# Patient Record
Sex: Male | Born: 1980 | Race: White | Hispanic: No | Marital: Married | Smoking: Current every day smoker
Health system: Southern US, Community
[De-identification: ages and names within clinical notes are randomized; demographics above are authoritative.]

## PROBLEM LIST (undated history)

## (undated) DIAGNOSIS — R569 Unspecified convulsions: Secondary | ICD-10-CM

---
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
## 2012-11-14 ENCOUNTER — Emergency Department (HOSPITAL_COMMUNITY)
Admission: EM | Admit: 2012-11-14 | Discharge: 2012-11-14 | Disposition: A | Discharge status: Home / Self Care | Payer: Self-pay | Attending: Emergency Medicine | Admitting: Emergency Medicine

## 2012-11-14 ENCOUNTER — Emergency Department (HOSPITAL_COMMUNITY): Payer: Self-pay

## 2012-11-14 ENCOUNTER — Encounter (HOSPITAL_COMMUNITY): Payer: Self-pay | Admitting: *Deleted

## 2012-11-14 DIAGNOSIS — F17200 Nicotine dependence, unspecified, uncomplicated: Secondary | ICD-10-CM | POA: Insufficient documentation

## 2012-11-14 DIAGNOSIS — Y9339 Activity, other involving climbing, rappelling and jumping off: Secondary | ICD-10-CM | POA: Insufficient documentation

## 2012-11-14 DIAGNOSIS — X58XXXA Exposure to other specified factors, initial encounter: Secondary | ICD-10-CM | POA: Insufficient documentation

## 2012-11-14 DIAGNOSIS — M79672 Pain in left foot: Secondary | ICD-10-CM

## 2012-11-14 DIAGNOSIS — S99929A Unspecified injury of unspecified foot, initial encounter: Secondary | ICD-10-CM | POA: Insufficient documentation

## 2012-11-14 DIAGNOSIS — Y929 Unspecified place or not applicable: Secondary | ICD-10-CM | POA: Insufficient documentation

## 2012-11-14 DIAGNOSIS — S8990XA Unspecified injury of unspecified lower leg, initial encounter: Secondary | ICD-10-CM | POA: Insufficient documentation

## 2012-11-14 DIAGNOSIS — Z8669 Personal history of other diseases of the nervous system and sense organs: Secondary | ICD-10-CM | POA: Insufficient documentation

## 2012-11-14 HISTORY — DX: Unspecified convulsions: R56.9

## 2012-11-14 MED ORDER — IBUPROFEN 800 MG PO TABS
800.0000 mg | ORAL_TABLET | Freq: Once | ORAL | Status: AC
  Administered 2012-11-14: 800 mg via ORAL
  Filled 2012-11-14: qty 1

## 2012-11-14 NOTE — ED Provider Notes (Signed)
History    CSN: [id number] Arrival date & time 11/14/12  0517  First MD Initiated Contact with Patient 11/14/12 [phone]     Chief Complaint  Patient presents with  . Foot Pain   (Consider location/radiation/quality/duration/timing/severity/associated sxs/prior Treatment) HPI HPI Comments: Donald Davies is a 32 y.o. male who presents to the Emergency Department complaining of left foot pain brought in by [facility] Department. He jumped off a deck two days ago and hurt his foot. He now has swelling and bruising to the foot. Denies fever, chills, inability to walk.   Past Medical History  Diagnosis Date  . Seizures    History reviewed. No pertinent past surgical history. History reviewed. No pertinent family history. History  Substance Use Topics  . Smoking status: Current Every Day Smoker  . Smokeless tobacco: Not on file  . Alcohol Use: No    Review of Systems  Constitutional: Negative for fever.       10 Systems reviewed and are negative for acute change except as noted in the HPI.  HENT: Negative for congestion.   Eyes: Negative for discharge and redness.  Respiratory: Negative for cough and shortness of breath.   Cardiovascular: Negative for chest pain.  Gastrointestinal: Negative for vomiting and abdominal pain.  Musculoskeletal: Negative for back pain.       Left foot pain  Skin: Negative for rash.  Neurological: Negative for syncope, numbness and headaches.  Psychiatric/Behavioral:       No behavior change.    Allergies  Ritalin and Robitussin (alcohol free)  Home Medications  No current outpatient prescriptions on file. BP 125/82  Pulse 101  Temp(Src) 98.9 F (37.2 C) (Oral)  Resp 18  Ht 6' (1.829 m)  Wt 163 lb (73.936 kg)  BMI 22.1 kg/m2  SpO2 100% Physical Exam  Nursing note and vitals reviewed. Constitutional: He appears well-developed and well-nourished.  Awake, alert, nontoxic appearance.  HENT:  Head: Normocephalic and  atraumatic.  Eyes: EOM are normal. Pupils are equal, round, and reactive to light.  Neck: Normal range of motion. Neck supple.  Cardiovascular: Normal rate and intact distal pulses.   Pulmonary/Chest: Effort normal and breath sounds normal. He exhibits no tenderness.  Abdominal: Soft. Bowel sounds are normal. There is no tenderness. There is no rebound.  Musculoskeletal: He exhibits no tenderness.  Baseline ROM, no obvious new focal weakness. Left foot with mild swelling over the dorsum of the foot, mild swelling to the lateral side of the foot, bruising that extends along the lateral edge of the foot. Able to move all toes. Pulses 2+  Neurological:  Mental status and motor strength appears baseline for patient and situation.  Skin: No rash noted.  Psychiatric: He has a normal mood and affect.    ED Course  Procedures (including critical care time) Labs Reviewed - No data to display Dg Foot Complete Left  11/14/2012   *RADIOLOGY REPORT*  Clinical Data: Left foot pain and bruising.  Swelling of the left foot.  LEFT FOOT - COMPLETE 3+ VIEW  Comparison: None.  Findings: Anatomic alignment of the left foot.  No fracture is identified.  Mild forefoot soft tissue swelling is present.  IMPRESSION: No acute osseous abnormality.   Original Report Authenticated By: Andreas Newport, M.D.     MDM  Patient who presented with left foot pain from an injury sustained two days ago. Xray is negative for acute injury or broken bones. Given ibuprofen. Pt stable in ED with no significant  deterioration in condition.The patient appears reasonably screened and/or stabilized for discharge and I doubt any other medical condition or other [facility] requiring further screening, evaluation, or treatment in the ED at this time prior to discharge.  MDM Reviewed: nursing note and vitals Interpretation: x-ray     Nicoletta Dress. Colon Branch, MD 11/14/12 [phone]

## 2012-11-14 NOTE — ED Notes (Signed)
Pt c/o left foot pain. 

## 2013-06-26 ENCOUNTER — Telehealth (HOSPITAL_COMMUNITY): Payer: Self-pay | Admitting: *Deleted

## 2013-06-26 NOTE — Telephone Encounter (Signed)
Opened in error

## 2014-04-28 ENCOUNTER — Encounter (HOSPITAL_COMMUNITY): Payer: Self-pay | Admitting: Cardiology

## 2014-04-28 ENCOUNTER — Emergency Department (HOSPITAL_COMMUNITY)
Admission: EM | Admit: 2014-04-28 | Discharge: 2014-04-28 | Disposition: A | Discharge status: Home / Self Care | Payer: Self-pay | Attending: Emergency Medicine | Admitting: Emergency Medicine

## 2014-04-28 DIAGNOSIS — H6503 Acute serous otitis media, bilateral: Secondary | ICD-10-CM | POA: Insufficient documentation

## 2014-04-28 DIAGNOSIS — Z720 Tobacco use: Secondary | ICD-10-CM | POA: Insufficient documentation

## 2014-04-28 DIAGNOSIS — H7291 Unspecified perforation of tympanic membrane, right ear: Secondary | ICD-10-CM | POA: Insufficient documentation

## 2014-04-28 DIAGNOSIS — K029 Dental caries, unspecified: Secondary | ICD-10-CM | POA: Insufficient documentation

## 2014-04-28 DIAGNOSIS — K088 Other specified disorders of teeth and supporting structures: Secondary | ICD-10-CM | POA: Insufficient documentation

## 2014-04-28 DIAGNOSIS — J029 Acute pharyngitis, unspecified: Secondary | ICD-10-CM | POA: Insufficient documentation

## 2014-04-28 MED ORDER — AMOXICILLIN 500 MG PO CAPS: 500.0000 mg | ORAL_CAPSULE | Freq: Three times a day (TID) | ORAL | Status: DC

## 2014-04-28 MED ORDER — OXYCODONE-ACETAMINOPHEN 5-325 MG PO TABS
1.0000 | ORAL_TABLET | Freq: Once | ORAL | Status: AC
  Administered 2014-04-28: 1 via ORAL
  Filled 2014-04-28: qty 1

## 2014-04-28 MED ORDER — AMOXICILLIN 250 MG PO CAPS
500.0000 mg | ORAL_CAPSULE | Freq: Once | ORAL | Status: AC
  Administered 2014-04-28: 500 mg via ORAL
  Filled 2014-04-28: qty 2

## 2014-04-28 MED ORDER — HYDROCODONE-ACETAMINOPHEN 5-325 MG PO TABS: 1.0000 | ORAL_TABLET | ORAL | Status: DC | PRN

## 2014-04-28 NOTE — ED Provider Notes (Signed)
CSN: [id number]     Arrival date & time 04/28/14  0820 History   First MD Initiated Contact with Patient 04/28/14 [phone]     Chief Complaint  Patient presents with  . Sore Throat     (Consider location/radiation/quality/duration/timing/severity/associated sxs/prior Treatment) Patient is a 33 y.o. male presenting with pharyngitis. The history is provided by the patient. No language interpreter was used.  Sore Throat This is a new problem. The current episode started in the past 7 days. The problem occurs constantly. The problem has been gradually worsening. Associated symptoms include a sore throat. The symptoms are aggravated by swallowing. He has tried NSAIDs for the symptoms. The treatment provided mild relief.   Donald DecampBrian Davies is a 33 y.o. male who presents to the ED with a sore throat that started one week ago. He describes the pain as throbbing on the left side. He complains of pain in both ears and pain in the left upper second and third molars. He rates the pain as 9/10. He has taken ibuprofen with mild relief. He has no difficulty swallowing. No one else in the house with a sore throat. Hx of ear problems when he was younger requiring tubes in his ears. Patient is an every day smoker.   Past Medical History  Diagnosis Date  . Seizures    History reviewed. No pertinent past surgical history. History reviewed. No pertinent family history. History  Substance Use Topics  . Smoking status: Current Every Day Smoker  . Smokeless tobacco: Not on file  . Alcohol Use: No    Review of Systems  HENT: Positive for dental problem, ear pain and sore throat. Negative for trouble swallowing. Hearing loss: decreased.   All other systems negative    Allergies  Ritalin and Robitussin (alcohol free)  Home Medications   Prior to Admission medications   Not on File   BP 138/91 mmHg  Pulse 85  Temp(Src) 98 F (36.7 C) (Oral)  Resp 18  Ht 6' (1.829 m)  Wt 163 lb (73.936 kg)  BMI 22.10  kg/m2  SpO2 96% Physical Exam  Constitutional: He is oriented to person, place, and time. He appears well-developed and well-nourished.  HENT:  Head: Normocephalic and atraumatic.  Right Ear: Tympanic membrane is scarred, perforated and erythematous.  Left Ear: Tympanic membrane is scarred, erythematous and retracted.  Nose: Rhinorrhea present.  Mouth/Throat: Uvula is midline and mucous membranes are normal. Posterior oropharyngeal erythema present.    Left upper second and third molars with decay and tenderness on exam.   Eyes: EOM are normal.  Neck: Neck supple.  Cardiovascular: Normal rate.   Pulmonary/Chest: Effort normal.  Musculoskeletal: Normal range of motion.  Lymphadenopathy:    He has cervical adenopathy (left).  Neurological: He is alert and oriented to person, place, and time. No cranial nerve deficit.  Skin: Skin is warm and dry.  Psychiatric: He has a normal mood and affect. His behavior is normal.  Nursing note and vitals reviewed.   ED Course  Procedures    MDM  33 y.o. male with sore throat, ear pain and dental pain. Stable for discharge without fever, difficulty swallowing or signs of sepsis. Will treat for pain and infection. He will continue to take ibuprofen and follow up with his PCP on Monday as scheduled and the dentist next week as scheduled for dental extraction. He will return here as needed for problems. Discussed with the patient and all questioned fully answered. Otitis media, bilateral with TM perforation  right Pharyngitis Dental pain due to caries   Medication List    TAKE these medications        amoxicillin 500 MG capsule  Commonly known as:  AMOXIL  Take 1 capsule (500 mg total) by mouth 3 (three) times daily.     HYDROcodone-acetaminophen 5-325 MG per tablet  Commonly known as:  NORCO/VICODIN  Take 1 tablet by mouth every 4 (four) hours as needed.              LivermoreHope M Amari Zagal, [place]NP 04/28/14 [id number]  Juliet RudeNathan R. Rubin PayorPickering,  MD 04/28/14 [phone]

## 2014-04-28 NOTE — ED Notes (Signed)
sorethroat times one week.

## 2014-10-04 ENCOUNTER — Encounter (HOSPITAL_COMMUNITY): Payer: Self-pay | Admitting: Emergency Medicine

## 2014-10-04 ENCOUNTER — Emergency Department (HOSPITAL_COMMUNITY): Payer: Self-pay

## 2014-10-04 ENCOUNTER — Inpatient Hospital Stay (HOSPITAL_COMMUNITY): Payer: Self-pay

## 2014-10-04 ENCOUNTER — Inpatient Hospital Stay (HOSPITAL_COMMUNITY)
Admission: EM | Admit: 2014-10-04 | Discharge: 2014-10-09 | DRG: 131 | Disposition: A | Discharge status: Home / Self Care | Payer: Self-pay | Attending: Internal Medicine | Admitting: Internal Medicine

## 2014-10-04 DIAGNOSIS — Z299 Encounter for prophylactic measures, unspecified: Secondary | ICD-10-CM

## 2014-10-04 DIAGNOSIS — L0201 Cutaneous abscess of face: Secondary | ICD-10-CM

## 2014-10-04 DIAGNOSIS — F17210 Nicotine dependence, cigarettes, uncomplicated: Secondary | ICD-10-CM | POA: Diagnosis present

## 2014-10-04 DIAGNOSIS — L03211 Cellulitis of face: Secondary | ICD-10-CM | POA: Diagnosis present

## 2014-10-04 DIAGNOSIS — K5900 Constipation, unspecified: Secondary | ICD-10-CM | POA: Diagnosis present

## 2014-10-04 DIAGNOSIS — K029 Dental caries, unspecified: Secondary | ICD-10-CM | POA: Diagnosis present

## 2014-10-04 DIAGNOSIS — F17200 Nicotine dependence, unspecified, uncomplicated: Secondary | ICD-10-CM | POA: Diagnosis present

## 2014-10-04 DIAGNOSIS — L089 Local infection of the skin and subcutaneous tissue, unspecified: Secondary | ICD-10-CM | POA: Insufficient documentation

## 2014-10-04 DIAGNOSIS — G4700 Insomnia, unspecified: Secondary | ICD-10-CM | POA: Diagnosis present

## 2014-10-04 DIAGNOSIS — R220 Localized swelling, mass and lump, head: Secondary | ICD-10-CM

## 2014-10-04 DIAGNOSIS — K045 Chronic apical periodontitis: Secondary | ICD-10-CM | POA: Diagnosis present

## 2014-10-04 DIAGNOSIS — K047 Periapical abscess without sinus: Principal | ICD-10-CM | POA: Diagnosis present

## 2014-10-04 DIAGNOSIS — B998 Other infectious disease: Secondary | ICD-10-CM

## 2014-10-04 DIAGNOSIS — D72829 Elevated white blood cell count, unspecified: Secondary | ICD-10-CM | POA: Diagnosis present

## 2014-10-04 DIAGNOSIS — Z7901 Long term (current) use of anticoagulants: Secondary | ICD-10-CM

## 2014-10-04 DIAGNOSIS — K040 Pulpitis: Secondary | ICD-10-CM

## 2014-10-04 LAB — BASIC METABOLIC PANEL
Anion gap: 9 (ref 5–15)
Anion gap: 11 (ref 5–15)
BUN: 15 mg/dL (ref 6–20)
BUN: 9 mg/dL (ref 6–20)
CALCIUM: 9.1 mg/dL (ref 8.9–10.3)
CO2: 26 mmol/L (ref 22–32)
CO2: 25 mmol/L (ref 22–32)
Calcium: 9.5 mg/dL (ref 8.9–10.3)
Chloride: 100 mmol/L — ABNORMAL LOW (ref 101–111)
Chloride: 104 mmol/L (ref 101–111)
Creatinine, Ser: 0.94 mg/dL (ref 0.61–1.24)
Creatinine, Ser: 0.9 mg/dL (ref 0.61–1.24)
GFR calc Af Amer: >60 mL/min (ref >60)
GFR calc Af Amer: >60 mL/min (ref >60)
Glucose, Bld: 129 mg/dL — ABNORMAL HIGH (ref 65–99)
Glucose, Bld: 99 mg/dL (ref 65–99)
POTASSIUM: 3.5 mmol/L (ref 3.5–5.1)
Potassium: 4.6 mmol/L (ref 3.5–5.1)
SODIUM: 135 mmol/L (ref 135–145)
SODIUM: 140 mmol/L (ref 135–145)

## 2014-10-04 LAB — CBC WITH DIFFERENTIAL/PLATELET
Basophils Absolute: 0 10*3/uL (ref 0.0–0.1)
Basophils Relative: 0 % (ref 0–1)
EOS ABS: 0.1 10*3/uL (ref 0.0–0.7)
EOS PCT: 1 % (ref 0–5)
HCT: 47.5 % (ref 39.0–52.0)
Hemoglobin: 16 g/dL (ref 13.0–17.0)
Lymphocytes Relative: 15 % (ref 12–46)
Lymphs Abs: 2.4 10*3/uL (ref 0.7–4.0)
MCH: 30.4 pg (ref 26.0–34.0)
MCHC: 33.7 g/dL (ref 30.0–36.0)
MCV: 90.3 fL (ref 78.0–100.0)
MONO ABS: 1.5 10*3/uL — AB (ref 0.1–1.0)
Monocytes Relative: 9 % (ref 3–12)
NEUTROS ABS: 12.4 10*3/uL — AB (ref 1.7–7.7)
NEUTROS PCT: 75 % (ref 43–77)
Platelets: 285 10*3/uL (ref 150–400)
RBC: 5.26 MIL/uL (ref 4.22–5.81)
RDW: 13.5 % (ref 11.5–15.5)
WBC: 16.4 10*3/uL — AB (ref 4.0–10.5)

## 2014-10-04 LAB — CBC
HCT: 47.2 % (ref 39.0–52.0)
Hemoglobin: 15.7 g/dL (ref 13.0–17.0)
MCH: 29.7 pg (ref 26.0–34.0)
MCHC: 33.3 g/dL (ref 30.0–36.0)
MCV: 89.4 fL (ref 78.0–100.0)
PLATELETS: 254 10*3/uL (ref 150–400)
RBC: 5.28 MIL/uL (ref 4.22–5.81)
RDW: 13.8 % (ref 11.5–15.5)
WBC: 13.8 10*3/uL — ABNORMAL HIGH (ref 4.0–10.5)

## 2014-10-04 LAB — LACTIC ACID, PLASMA: Lactic Acid, Venous: 1.2 mmol/L (ref 0.5–2.0)

## 2014-10-04 MED ORDER — MORPHINE SULFATE 4 MG/ML IJ SOLN
4.0000 mg | Freq: Once | INTRAMUSCULAR | Status: AC
  Administered 2014-10-04: 4 mg via INTRAVENOUS
  Filled 2014-10-04: qty 1

## 2014-10-04 MED ORDER — ACETAMINOPHEN 500 MG PO TABS
1000.0000 mg | ORAL_TABLET | Freq: Once | ORAL | Status: AC
  Administered 2014-10-04: 1000 mg via ORAL
  Filled 2014-10-04: qty 2

## 2014-10-04 MED ORDER — OXYCODONE HCL 5 MG PO TABS
5.0000 mg | ORAL_TABLET | ORAL | Status: DC | PRN
  Administered 2014-10-05 – 2014-10-09 (×8): 5 mg via ORAL
  Filled 2014-10-04 (×11): qty 1

## 2014-10-04 MED ORDER — ZOLPIDEM TARTRATE 5 MG PO TABS: 5.0000 mg | ORAL_TABLET | Freq: Every evening | ORAL | Status: DC | PRN

## 2014-10-04 MED ORDER — ONDANSETRON HCL 4 MG/2ML IJ SOLN: 4.0000 mg | Freq: Four times a day (QID) | INTRAMUSCULAR | Status: DC | PRN

## 2014-10-04 MED ORDER — CLINDAMYCIN PHOSPHATE 600 MG/50ML IV SOLN
600.0000 mg | Freq: Three times a day (TID) | INTRAVENOUS | Status: DC
  Administered 2014-10-04 – 2014-10-09 (×15): 600 mg via INTRAVENOUS
  Filled 2014-10-04 (×18): qty 50

## 2014-10-04 MED ORDER — ALUM & MAG HYDROXIDE-SIMETH 200-200-20 MG/5ML PO SUSP: 30.0000 mL | Freq: Four times a day (QID) | ORAL | Status: DC | PRN

## 2014-10-04 MED ORDER — NICOTINE 7 MG/24HR TD PT24
7.0000 mg | MEDICATED_PATCH | Freq: Every day | TRANSDERMAL | Status: DC
  Administered 2014-10-09: 7 mg via TRANSDERMAL
  Filled 2014-10-04 (×7): qty 1

## 2014-10-04 MED ORDER — HYDROMORPHONE HCL 1 MG/ML IJ SOLN
0.5000 mg | INTRAMUSCULAR | Status: DC | PRN
  Administered 2014-10-04 – 2014-10-09 (×32): 1 mg via INTRAVENOUS
  Filled 2014-10-04 (×34): qty 1

## 2014-10-04 MED ORDER — SODIUM CHLORIDE 0.9 % IV SOLN
INTRAVENOUS | Status: DC
  Administered 2014-10-04 – 2014-10-06 (×5): via INTRAVENOUS
  Administered 2014-10-07: 75 mL/h via INTRAVENOUS
  Administered 2014-10-07 – 2014-10-08 (×2): via INTRAVENOUS

## 2014-10-04 MED ORDER — CLINDAMYCIN PHOSPHATE 600 MG/50ML IV SOLN
600.0000 mg | Freq: Once | INTRAVENOUS | Status: AC
  Administered 2014-10-04: 600 mg via INTRAVENOUS
  Filled 2014-10-04: qty 50

## 2014-10-04 MED ORDER — ACETAMINOPHEN 325 MG PO TABS
650.0000 mg | ORAL_TABLET | Freq: Four times a day (QID) | ORAL | Status: DC | PRN
  Filled 2014-10-04 (×2): qty 2

## 2014-10-04 MED ORDER — ONDANSETRON HCL 4 MG/2ML IJ SOLN
4.0000 mg | Freq: Once | INTRAMUSCULAR | Status: AC
  Administered 2014-10-04: 4 mg via INTRAMUSCULAR
  Filled 2014-10-04: qty 2

## 2014-10-04 MED ORDER — CLINDAMYCIN PHOSPHATE 600 MG/50ML IV SOLN: 600.0000 mg | Freq: Three times a day (TID) | INTRAVENOUS | Status: DC

## 2014-10-04 MED ORDER — IOHEXOL 300 MG/ML  SOLN
80.0000 mL | Freq: Once | INTRAMUSCULAR | Status: AC | PRN
  Administered 2014-10-04: 80 mL via INTRAVENOUS

## 2014-10-04 MED ORDER — ONDANSETRON HCL 4 MG PO TABS: 4.0000 mg | ORAL_TABLET | Freq: Four times a day (QID) | ORAL | Status: DC | PRN

## 2014-10-04 MED ORDER — ACETAMINOPHEN 650 MG RE SUPP: 650.0000 mg | Freq: Four times a day (QID) | RECTAL | Status: DC | PRN

## 2014-10-04 NOTE — ED Notes (Signed)
[id number] Spoke with Bjorn LoserRhonda and they will send a truck.

## 2014-10-04 NOTE — Consult Note (Signed)
DENTAL CONSULTATION  Date of Consultation:  10/04/2014   Patient Name:   Donald DecampBrian Penman Date of Birth:   January 11, 1981 Medical Record Number: [id number]  VITALS: BP 129/77 mmHg  Pulse 89  Temp(Src) 97.3 F (36.3 C) (Oral)  Resp 16  Ht 5\' 11"  (1.803 m)  Wt 135 lb 4.8 oz (61.372 kg)  BMI 18.88 kg/m2  SpO2 100%  CHIEF COMPLAINT: Patient was referred by Dr. Gwenlyn PerkingMadera for evaluation of left facial swelling.   HPI: Donald Davies is a 34 year old male that was [facility] of left facial swelling. [facility] and treatment as indicated. Patient currently is on IV clindamycin antibiotic therapy. Patient was referred by Dr. Gwenlyn PerkingMadera for evaluation of left facial swelling and poor dentition.  The patient has a history of intermittent toothache symptoms over the past several months. Patient placed a DenTemp OTC material in the upper left molar 2 to 3 weeks ago. The patient is swelling subsequently increased. Patient described pain that reached an 8 out of 10 in intensity but is currently 6 out of 10 with pain medication and IV antibiotics. Patient developed left facial swelling yesterday and [facility]. [facility] was subsequently [facility] for [facility] and treatment as indicated. Patient indicates that he has not seen a dentist for "a long time". This is approximately 5-6 years ago. Patient had several "fillings" placed at that time. The patient does not seek regular dental care. The patient has no primary dentist. Patient does have a history of previous orthodontic therapy.   PROBLEM LIST: Patient Active Problem List   Diagnosis Date Noted  . Facial abscess 10/04/2014  . Cellulitis and abscess of face 10/04/2014  . Leukocytosis 10/04/2014  . Tobacco use disorder 10/04/2014  . DVT prophylaxis 10/04/2014  . Dental abscess   . Facial infection      PMH: Past Medical History  Diagnosis Date  . Seizures     PSH: History reviewed. No pertinent past surgical history.  ALLERGIES: Allergies  Allergen Reactions  . Ritalin [Methylphenidate Hcl]   . Robitussin (Alcohol Free) [Guaifenesin]     MEDICATIONS: Current Facility-Administered Medications  Medication Dose Route Frequency Provider Last Rate Last Dose  . 0.9 %  sodium chloride infusion   Intravenous Continuous Ron ParkerHarvette C Jenkins, MD 100 mL/hr at 10/04/14 [phone]    . acetaminophen (TYLENOL) tablet 650 mg  650 mg Oral Q6H PRN Ron ParkerHarvette C Jenkins, MD       Or  . acetaminophen (TYLENOL) suppository 650 mg  650 mg Rectal Q6H PRN Ron ParkerHarvette C Jenkins, MD      . alum & mag hydroxide-simeth (MAALOX/MYLANTA) 200-200-20 MG/5ML suspension 30 mL  30 mL Oral Q6H PRN Ron ParkerHarvette C Jenkins, MD      . clindamycin (CLEOCIN) IVPB 600 mg  600 mg Intravenous 3 times per day Ron ParkerHarvette C Jenkins, MD   600 mg at 10/04/14 1202  . HYDROmorphone (DILAUDID) injection 0.5-1 mg  0.5-1 mg Intravenous Q3H PRN Ron ParkerHarvette C Jenkins, MD   1 mg at 10/04/14 1202  . nicotine (NICODERM CQ - dosed in mg/24 hr) patch 7 mg  7 mg Transdermal Daily Ron ParkerHarvette C Jenkins, MD   7 mg at 10/04/14 [id number]  . ondansetron (ZOFRAN) tablet 4 mg  4 mg Oral Q6H PRN Ron ParkerHarvette C Jenkins, MD       Or  . ondansetron [facility](ZOFRAN) injection 4 mg  4 mg Intravenous Q6H PRN Ron ParkerHarvette C Jenkins, MD      . oxyCODONE (Oxy IR/ROXICODONE) immediate release tablet 5 mg  5 mg Oral Q4H PRN Ron ParkerHarvette C Jenkins, MD        LABS: Lab Results  Component Value Date   WBC 13.8* 10/04/2014   HGB 15.7 10/04/2014   HCT 47.2 10/04/2014   MCV 89.4 10/04/2014   PLT 254 10/04/2014      Component Value Date/Time   NA 140 10/04/2014 0845   K 4.6 10/04/2014 0845   CL 104 10/04/2014 0845   CO2 25 10/04/2014 0845   GLUCOSE 99 10/04/2014 0845   BUN 9 10/04/2014 0845   CREATININE 0.90 10/04/2014 0845   CALCIUM 9.5 10/04/2014 0845   GFRNONAA >60 10/04/2014 0845   GFRAA  >60 10/04/2014 0845   No results found for: INR, PROTIME No results found for: PTT  SOCIAL HISTORY: History   Social History  . Marital Status: Single    Spouse Name: N/A  . Number of Children: N/A  . Years of Education: N/A   Occupational History  . Not on file.   Social History Main Topics  . Smoking status: Current Every Day Smoker  . Smokeless tobacco: Not on file  . Alcohol Use: No  . Drug Use: No  . Sexual Activity: Not on file   Other Topics Concern  . Not on file   Social History Narrative    FAMILY HISTORY: History reviewed. No pertinent family history.  REVIEW OF SYSTEMS: Reviewed from chart for this admission.  DENTAL HISTORY: CHIEF COMPLAINT: Patient was referred by Dr. Gwenlyn PerkingMadera for evaluation of left facial swelling.   HPI: Donald Davies is a 34 year old male that was recently [facility] for evaluation of left facial swelling. Patient was then [facility] for evaluation and treatment as indicated. Patient currently is on IV clindamycin antibiotic therapy. Patient was referred by Dr. Gwenlyn PerkingMadera for evaluation of left facial swelling and poor dentition.  The patient has a history of intermittent toothache symptoms over the past several months. Patient placed a DenTemp OTC material in the upper left molar 2 to 3 weeks ago. The patient is swelling subsequently increased. Patient described pain that reached an 8 out of 10 in intensity but is currently 6 out of 10 with pain medication and IV antibiotics. Patient developed left facial swelling yesterday and [facility]. Patient was subsequently transferred [facility] for evaluation and treatment as indicated. Patient indicates that he has not seen a dentist for "a long time". This is approximately 5-6 years ago. Patient had several "fillings" placed at that time. The patient does not seek regular dental care. The patient has no  primary dentist. Patient does have a history of previous orthodontic therapy.  DENTAL EXAMINATION: GENERAL: The patient is a well-developed, well-nourished male in no acute distress. HEAD AND NECK: Patient has left neck lymphadenopathy. Patient has trismus symptoms that prevents him from opening wide. Patient's maximum interincisal opening is now approximately 15 mm. INTRAORAL EXAM: Patient has normal saliva. Patient appears to have some left buccal swelling in the area of #14 (first molar). DENTITION: The patient is missing tooth numbers 1, 4, 13, 16, 19.  Tooth #32 is partially impacted. PERIODONTAL: Patient has chronic periodontitis with plaque and calculus accumulations, selective areas gingival recession and incipient tooth mobility. DENTAL CARIES/SUBOPTIMAL RESTORATIONS: Patient has rampant dental caries affecting multiple teeth. I would  need a full series of dental radiographs to identify the extent of the dental caries. ENDODONTIC: Patient has a history of acute pulpitis symptoms. There is periapical pathology and radiolucency associated with tooth numbers 12, 14, 16, and 18. CROWN AND BRIDGE: There are no crown or bridge restorations noted. PROSTHODONTIC: Patient denies having any partial dentures. OCCLUSION: Patient has a poor occlusal scheme secondary to multiple missing teeth and superior eruption and drifting of the unopposed teeth into the edentulous areas. Patient does have a history of previous orthodontic therapy.  RADIOGRAPHIC INTERPRETATION: An orthopantogram was taken on 10/04/2014. There are multiple missing teeth. There are rampant dental caries noted. There is evidence of periapical pathology and radiolucency. There is incipient to moderate bone loss noted. There is partially impacted #32 noted. There are multiple dental restorations noted. There multiple teeth with temporary restorations noted.  ASSESSMENTS: 1. History of left facial swelling 2. Acute pulpitis 3. Chronic  apical periodontitis 4. Trismus with decreased maximum interincisal opening 5. Rampant dental caries 6. Chronic periodontitis with bone loss 7. Accretions 8. Selective areas gingival recession 9. Incipient tooth mobility 10. Multiple missing teeth 11. Partially impacted #32 12. Multiple suboptimal permanent and temporary restorations. 13. History of oral neglect  PLAN/RECOMMENDATIONS: 1. I discussed the risks, benefits, and complications of various treatment options with the patient in relationship to his medical and dental conditions. We discussed various treatment options to include no treatment, multiple extractions with alveoloplasty, pre-prosthetic surgery as indicated, periodontal therapy, dental restorations, root canal therapy, crown and bridge therapy, implant therapy, and replacement of missing teeth as indicated. The patient currently wishes to proceed with extraction of indicated teeth with alveoloplasty in the operating room with general anesthesia. This has been scheduled for Monday, 10/08/2014 at 7:30 AM in the [facility] operating room.  Due to the trismus symptoms and decreased maximum interincisal opening, continued IV antibiotic therapy is indicated at this time. If the patient is determined to be medically stable to allow for discharge prior to the Monday operating room procedure, this can be achieved by Dr. Gwenlyn Perking and the OR can be switched to an outpatient procedure. The patient will need to be informed of the time that he needs to present to the short stay area and he reminded to be nothing by mouth after midnight. The overall plan of care will then be decided at the time of surgery on Monday based on his presenting symptoms and ability to open wide enough to allow for dental treatment in the OR. In light of his presenting signs and symptoms and decreased maximum interincisal opening, I would favor continued hospitalization with administration of IV antibiotic therapy until  operating procedure on Monday.  2. Discussion of findings with medical team and coordination of future medical and dental care as needed.   Charlynne Pander, DDS

## 2014-10-04 NOTE — Progress Notes (Signed)
New Admission Note:  Arrival Method:Via stretcher with [facility] RN Mental Orientation: Alert&orientedx4 Telemetry: n/a Assessment: Completed Skin: facial edema (left) and redness IV: Left AC, normal saline @100  ml/hr Pain: 5/10, see doc flowsheet Tubes: n/a Safety Measures: Safety Fall Prevention Plan was given, discussed and signed. [address] [place] Orientation: Patient has been orientated to the room, unit and the staff. Family: None at bedside  Orders have been reviewed and implemented. Will continue to monitor the patient. Call light has been placed within reach and bed alarm has been activated.   Tempie DonningMercy Estevan Kersh BSN, RN  Phone Number: [phone] [facility] [place] [facility]

## 2014-10-04 NOTE — ED Provider Notes (Signed)
CSN: [id number]     Arrival date & time 10/04/14  0049 History   First MD Initiated Contact with Patient 10/04/14 0101     Chief Complaint  Patient presents with  . Facial Pain  . Abscess     (Consider location/radiation/quality/duration/timing/severity/associated sxs/prior Treatment) HPI  This is a 34 year old male with a history of seizure who presents with left-sided facial swelling and pain. Patient reports that he woke up this morning with pain and swelling to the left cheek. He reports that he has a chronically bad tooth.  Reports fever to 102. No difficulty swallowing but "strange sensation in the throat." Does not have a dentist. Rates his pain 10 out of 10. Has not taken anything for the pain.  Past Medical History  Diagnosis Date  . Seizures    History reviewed. No pertinent past surgical history. History reviewed. No pertinent family history. History  Substance Use Topics  . Smoking status: Current Every Day Smoker  . Smokeless tobacco: Not on file  . Alcohol Use: No    Review of Systems  Constitutional: Positive for fever.  HENT: Positive for dental problem.        Facial swelling  Respiratory: Negative.  Negative for chest tightness and shortness of breath.   Cardiovascular: Negative.  Negative for chest pain.  Gastrointestinal: Negative.  Negative for abdominal pain.  Genitourinary: Negative.  Negative for dysuria.  Musculoskeletal: Negative for back pain.  Skin: Positive for color change. Negative for rash.  Neurological: Negative for headaches.  All other systems reviewed and are negative.     Allergies  Ritalin and Robitussin (alcohol free)  Home Medications   Prior to Admission medications   Medication Sig Start Date End Date Taking? Authorizing Provider  amoxicillin (AMOXIL) 500 MG capsule Take 1 capsule (500 mg total) by mouth 3 (three) times daily. 04/28/14   Hope Orlene OchM Neese, NP  HYDROcodone-acetaminophen (NORCO/VICODIN) 5-325 MG per tablet Take 1  tablet by mouth every 4 (four) hours as needed. 04/28/14   Hope Orlene OchM Neese, NP   BP 109/67 mmHg  Pulse 63  Temp(Src) 99 F (37.2 C) (Oral)  Resp 18  Ht 5\' 11"  (1.803 m)  Wt 150 lb (68.04 kg)  BMI 20.93 kg/m2  SpO2 99% Physical Exam  Constitutional: He is oriented to person, place, and time. He appears well-developed and well-nourished.  HENT:  Head: Normocephalic and atraumatic.  Swelling and mild erythema noted to the left cheek, poor dentition, broken tooth noted over the left upper premolar, small periapical abscess wo spontaneous drainage, tenderness to palpation along the gumline and cheek  Eyes: Pupils are equal, round, and reactive to light.  Neck: Normal range of motion. Neck supple.  Cardiovascular: Normal rate, regular rhythm and normal heart sounds.   No murmur heard. Pulmonary/Chest: Effort normal and breath sounds normal. No respiratory distress. He has no wheezes.  Abdominal: Soft. There is no rebound.  Musculoskeletal: He exhibits no edema.  Lymphadenopathy:    He has no cervical adenopathy.  Neurological: He is alert and oriented to person, place, and time.  Skin: Skin is warm.  Diaphoretic  Psychiatric: He has a normal mood and affect.  Nursing note and vitals reviewed.   ED Course  Procedures (including critical care time) Labs Review Labs Reviewed  CBC WITH DIFFERENTIAL/PLATELET - Abnormal; Notable for the following:    WBC 16.4 (*)    Neutro Abs 12.4 (*)    Monocytes Absolute 1.5 (*)    All other components within  normal limits  BASIC METABOLIC PANEL - Abnormal; Notable for the following:    Chloride 100 (*)    Glucose, Bld 129 (*)    All other components within normal limits  CULTURE, BLOOD (ROUTINE X 2)  CULTURE, BLOOD (ROUTINE X 2)  LACTIC ACID, PLASMA  LACTIC ACID, PLASMA    Imaging Review Ct Maxillofacial W/cm  10/04/2014   CLINICAL DATA:  Acute onset of left cheek swelling and erythema. Initial encounter.  EXAM: CT MAXILLOFACIAL WITH  CONTRAST  TECHNIQUE: Multidetector CT imaging of the maxillofacial structures was performed with intravenous contrast. Multiplanar CT image reconstructions were also generated. A small metallic BB was placed on the right temple in order to reliably differentiate right from left.  CONTRAST:  80mL OMNIPAQUE IOHEXOL 300 MG/ML  SOLN  COMPARISON:  None.  FINDINGS: There is diffuse soft tissue inflammation and edema along the left side of the face, overlying the left maxilla and upper mandible. This appears to reflect a 1.3 x 0.7 cm soft tissue abscess overlying the left maxilla, due to diffuse erosion of the left second maxillary premolar. An underlying periapical abscess is seen, which appears to extend into the left maxillary sinus, with mucosal thickening at the left maxillary sinus.  Additional partial absence of multiple teeth is noted, at the left second maxillary molar, left first maxillary premolar, right first maxillary molar, left second mandibular molar, left second mandibular premolar, right first mandibular molar, and right second mandibular molar.  There is no evidence of fracture or dislocation. The maxilla and mandible appear intact. The nasal bone is unremarkable in appearance.  The orbits are intact bilaterally. The remaining visualized paranasal sinuses and mastoid air cells are well-aerated.  No significant soft tissue abnormalities are seen. The parapharyngeal fat planes are preserved. The nasopharynx, oropharynx and hypopharynx are unremarkable in appearance. The visualized portions of the valleculae and piriform sinuses are grossly unremarkable.  The parotid and submandibular glands are within normal limits. No cervical lymphadenopathy is seen. Visualized cervical nodes remain borderline normal in size.  IMPRESSION: 1. Periapical abscess at the root of the left second maxillary premolar, with diffuse erosion of the left second maxillary premolar. Overlying 1.3 x 0.7 cm soft tissue abscess noted,  with associated diffuse soft tissue inflammation and edema along the left side of the face. The periapical abscess appears to extend into the left maxillary sinus, with mucosal thickening at the left maxillary sinus. 2. Additional partial absence of multiple teeth, as described above.   Electronically Signed   By: Roanna RaiderJeffery  Chang M.D.   On: 10/04/2014 03:13     EKG Interpretation None      MDM   Final diagnoses:  Dental abscess  Facial infection    Patient presents with left-sided facial swelling, dental pain, fever. Nontoxic on exam. His diaphoretic and febrile to 101.2. Likely has periapical abscess but also has evidence of extension into the cheek with possible cellulitis and systemic involvement. Basic labwork obtained. Blood cultures obtained. Patient given IV clindamycin.  White blood count elevated to 16.4. CT max face with contrast shows periapical abscess with erosion into the premolar and left sinus. There is a 1.3 x 0.7 cm soft tissue abscess.  No oral surgeon on call. Discussed with Dr. Suszanne Connerseoh (ENT), who recommends admission for IV antibiotics and transfer to [facility] for dental consultation tomorrow.     Shon Batonourtney F Horton, MD 10/04/14 [phone]

## 2014-10-04 NOTE — ED Notes (Signed)
Patient reports woke up yesterday morning with redness and swelling to left cheek. Reports has a bad tooth left front tooth.

## 2014-10-04 NOTE — Progress Notes (Signed)
Patient seen and examined. Left facial swelling, pain and erythema present on exam. He was admitted at [facility] after midnight and transferred to [facility] for oral surgery evaluation due to ongoing dental abscess and facial cellulitis. Please referred to H&P dictated by Dr. Lovell SheehanJenkins for further info/details on admission.    Plan: -Panorex ordered -discussed with Dr. Kristin BruinsKulinski (he will see patient and follow Panorex) -continue IV antibiotics -continue PRN analgesics  Donald LollMadera, Donald Davies [phone]

## 2014-10-04 NOTE — H&P (Signed)
[facility] Admission History and Physical       Donald DecampBrian Heathman XBJ:[id number] DOB: 12-21-1980 DOA: 10/04/2014  Referring physician: EDP PCP: No PCP Per Patient  Specialists:   Chief Complaint: Left Sided Facial Swelling and Fever  HPI: Donald Davies is a 34 y.o. male who presents to the ED with complaints of left sided facial swelling and pain since the AM along with fevers and chills.  He reports having a toothache for several months of the left maxillary second premolar tooth and in 04/2014 had Augmentin Rx and Hydrocodone prescribed.   He has not seen a Dentist, and has placed an OTC sealant on the tooth.    He was evaluated in the ED and ws found to have a temperature to 101.2.  A Maxillofacial CT scan was performed and revealed an abscess of the left maxillary second premolar extending into the maxillary sinus with erosion and associated facial edema.    Blood Cultures were sent and he was placed on IV Clindamycin.   The EDP DR Joanne GavelHorton Spoke with ENT on Call Dr Suszanne Connerseoh who recommended transfer to [facility] and a Dental Consult this AM.       Review of Systems:  Constitutional: No Weight Loss, No Weight Gain, Night Sweats, +Fevers, +Chills, Dizziness, Light Headedness, Fatigue, or Generalized Weakness HEENT: No Headaches, +Left Face and Cheek Pain,   Difficulty Swallowing,+Tooth/Dental Problems,Sore Throat,  No Sneezing, Rhinitis, Ear Ache, Nasal Congestion, or Post Nasal Drip,  Cardio-vascular:  No Chest pain, Orthopnea, PND, Edema in Lower Extremities, Anasarca, Dizziness, Palpitations  Resp: No Dyspnea, No DOE, No Productive Cough, No Non-Productive Cough, No Hemoptysis, No Wheezing.    GI: No Heartburn, Indigestion, Abdominal Pain, Nausea, Vomiting, Diarrhea, Constipation, Hematemesis, Hematochezia, Melena, Change in Bowel Habits,  Loss of Appetite  GU: No Dysuria, No Change in Color of Urine, No Urgency or Urinary Frequency, No Flank pain.  Musculoskeletal: No Joint Pain or  Swelling, No Decreased Range of Motion, No Back Pain.  Neurologic: No Syncope, No Seizures, Muscle Weakness, Paresthesia, Vision Disturbance or Loss, No Diplopia, No Vertigo, No Difficulty Walking,  Skin: No Rash or Lesions. Psych: No Change in Mood or Affect, No Depression or Anxiety, No Memory loss, No Confusion, or Hallucinations   Past Medical History  Diagnosis Date  . Seizures      History reviewed. No pertinent past surgical history.    Prior to Admission medications   Medication Sig Start Date End Date Taking? Authorizing Provider  amoxicillin (AMOXIL) 500 MG capsule Take 1 capsule (500 mg total) by mouth 3 (three) times daily. 04/28/14   Hope Orlene OchM Neese, NP  HYDROcodone-acetaminophen (NORCO/VICODIN) 5-325 MG per tablet Take 1 tablet by mouth every 4 (four) hours as needed. 04/28/14   Hope Orlene OchM Neese, NP     Allergies  Allergen Reactions  . Ritalin [Methylphenidate Hcl]   . Robitussin (Alcohol Free) [Guaifenesin]     Social History:  reports that he has been smoking.  He does not have any smokeless tobacco history on file. He reports that he does not drink alcohol or use illicit drugs.    History reviewed. No pertinent family history.     Physical Exam:  GEN:  Pleasant Well Nourished and Well Developed  [age] y.o. Caucasian male examined and in no acute distress; cooperative with exam Filed Vitals:   10/04/14 0101 10/04/14 0205 10/04/14 0332  BP: 140/76  109/67  Pulse: 88  63  Temp: 98.6 F (37 C) 101.2 F (38.4 C)  99 F (37.2 C)  TempSrc: Oral Rectal Oral  Resp: 16  18  Height:  (1.803 m)    Weight: 68.04 kg (150 lb)    SpO2: 99%  99%   Blood pressure 109/67, pulse 63, temperature 99 F (37.2 C), temperature source Oral, resp. rate 18, height  (1.803 m), weight 68.04 kg (150 lb), SpO2 99 %. PSYCH: He is alert and oriented x4; does not appear anxious does not appear depressed; affect is normal HEENT: Normocephalic and Atraumatic, Mucous membranes pink;  PERRLA; EOM intact; Fundi:  Benign;  No scleral icterus, Nares: Patent, Oropharynx: Clear, Fair Dentition,    Neck:  FROM, No Cervical Lymphadenopathy nor Thyromegaly or Carotid Bruit; No JVD; Breasts:: Not examined CHEST WALL: No tenderness CHEST: Normal respiration, clear to auscultation bilaterally HEART: Regular rate and rhythm; no murmurs rubs or gallops BACK: No kyphosis or scoliosis; No CVA tenderness ABDOMEN: Positive Bowel Sounds, Scaphoid, Soft Non-Tender, No Rebound or Guarding; No Masses, No Organomegaly. Rectal Exam: Not done EXTREMITIES: No Cyanosis, Clubbing, or Edema; No Ulcerations. Genitalia: not examined PULSES: 2+ and symmetric SKIN: Normal hydration no rash or ulceration, Multiple Tattoos (#35 total) CNS:  Alert and Oriented x 4, No Focal Deficits Vascular: pulses palpable throughout    Labs on Admission:  Basic Metabolic Panel:  Recent Labs Lab 10/04/14 0200  NA 135  K 3.5  CL 100*  CO2 26  GLUCOSE 129*  BUN 15  CREATININE 0.94  CALCIUM 9.1   Liver Function Tests: No results for input(s): AST, ALT, ALKPHOS, BILITOT, PROT, ALBUMIN in the last 168 hours. No results for input(s): LIPASE, AMYLASE in the last 168 hours. No results for input(s): AMMONIA in the last 168 hours. CBC:  Recent Labs Lab 10/04/14 0200  WBC 16.4*  NEUTROABS 12.4*  HGB 16.0  HCT 47.5  MCV 90.3  PLT 285   Cardiac Enzymes: No results for input(s): CKTOTAL, CKMB, CKMBINDEX, TROPONINI in the last 168 hours.  BNP (last 3 results) No results for input(s): BNP in the last 8760 hours.  ProBNP (last 3 results) No results for input(s): PROBNP in the last 8760 hours.  CBG: No results for input(s): GLUCAP in the last 168 hours.  Radiological Exams on Admission: Ct Maxillofacial W/cm  10/04/2014   CLINICAL DATA:  Acute onset of left cheek swelling and erythema. Initial encounter.  EXAM: CT MAXILLOFACIAL WITH CONTRAST  TECHNIQUE: Multidetector CT imaging of the maxillofacial  structures was performed with intravenous contrast. Multiplanar CT image reconstructions were also generated. A small metallic BB was placed on the right temple in order to reliably differentiate right from left.  CONTRAST:  80mL OMNIPAQUE IOHEXOL 300 MG/ML  SOLN  COMPARISON:  None.  FINDINGS: There is diffuse soft tissue inflammation and edema along the left side of the face, overlying the left maxilla and upper mandible. This appears to reflect a 1.3 x 0.7 cm soft tissue abscess overlying the left maxilla, due to diffuse erosion of the left second maxillary premolar. An underlying periapical abscess is seen, which appears to extend into the left maxillary sinus, with mucosal thickening at the left maxillary sinus.  Additional partial absence of multiple teeth is noted, at the left second maxillary molar, left first maxillary premolar, right first maxillary molar, left second mandibular molar, left second mandibular premolar, right first mandibular molar, and right second mandibular molar.  There is no evidence of fracture or dislocation. The maxilla and mandible appear intact. The nasal bone is unremarkable in appearance.  The orbits are intact bilaterally. The remaining visualized paranasal sinuses and mastoid air cells are well-aerated.  No significant soft tissue abnormalities are seen. The parapharyngeal fat planes are preserved. The nasopharynx, oropharynx and hypopharynx are unremarkable in appearance. The visualized portions of the valleculae and piriform sinuses are grossly unremarkable.  The parotid and submandibular glands are within normal limits. No cervical lymphadenopathy is seen. Visualized cervical nodes remain borderline normal in size.  IMPRESSION: 1. Periapical abscess at the root of the left second maxillary premolar, with diffuse erosion of the left second maxillary premolar. Overlying 1.3 x 0.7 cm soft tissue abscess noted, with associated diffuse soft tissue inflammation and edema along the  left side of the face. The periapical abscess appears to extend into the left maxillary sinus, with mucosal thickening at the left maxillary sinus. 2. Additional partial absence of multiple teeth, as described above.   Electronically Signed   By: Roanna RaiderJeffery  Chang M.D.   On: 10/04/2014 03:13      Assessment/Plan:   34 y.o. male with  Principal Problem:   1.   Cellulitis and abscess of face/Facial abscess   Blood culture sent   IV Clindamycin   EDP discussed with ENT Dr. Suszanne Connerseoh    And recommended Transfer to [facility] for Dental consult in AM   Active Problems:   2.   Leukocytosis- due to #1   Monitor Trend      3.   Tobacco use disorder   Counseled Re: Cessation   Nicotine Patch 7 mg daily         4.   DVT prophylaxis   SCDs       Code Status:     FULL CODE        Family Communication:   No Family Present    Disposition Plan:    Inpatient Status         Time spent: 7470 Minutes      Ron ParkerJENKINS,Jevante Hollibaugh C [facility] Pager [phone]   If 7AM -7PM Please Contact the Day Rounding Team MD for [facility]  If 7PM-7AM, Please Contact Night-Floor Coverage  [email or web address] Password [email or web address] 10/04/2014, 4:51 AM     ADDENDUM:   Patient was seen and examined on 10/04/2014

## 2014-10-04 NOTE — Consult Note (Signed)
Reason for Consult: Dental/facial abscess  HPI:  Donald Davies is an 34 y.o. male who was transported from [facility] ER this morning for treatment of his dental/facial abscess. The patient presented to the [facility] ED with complaints of left sided facial swelling and pain, fevers, and chills. He reports having a toothache for several months of the left maxillary second premolar tooth and in 04/2014 had Augmentin Rx and Hydrocodone prescribed. He has not seen a Dentist, and has placed an OTC sealant on the tooth a few days ago. He was evaluated in the ED and ws found to have a temperature to 101.2. A Maxillofacial CT scan was performed and revealed an abscess of the left maxillary second premolar and associated facial edema. Blood Cultures were sent and he was placed on IV Clindamycin.  Past Medical History  Diagnosis Date  . Seizures     History reviewed. No pertinent past surgical history.  History reviewed. No pertinent family history.  Social History:  reports that he has been smoking.  He does not have any smokeless tobacco history on file. He reports that he does not drink alcohol or use illicit drugs.  Allergies:  Allergies  Allergen Reactions  . Ritalin [Methylphenidate Hcl]   . Robitussin (Alcohol Free) [Guaifenesin]     Prior to Admission medications   Medication Sig Start Date End Date Taking? Authorizing Provider  amoxicillin (AMOXIL) 500 MG capsule Take 1 capsule (500 mg total) by mouth 3 (three) times daily. 04/28/14   Hope Bunnie Pion, NP  HYDROcodone-acetaminophen (NORCO/VICODIN) 5-325 MG per tablet Take 1 tablet by mouth every 4 (four) hours as needed. 04/28/14   [place], NP    Medications:  I have reviewed the patient's current medications. Scheduled: . clindamycin (CLEOCIN) IV  600 mg Intravenous 3 times per day  . nicotine  7 mg Transdermal Daily   JJK:[id number] **OR** acetaminophen, alum & mag hydroxide-simeth, HYDROmorphone (DILAUDID) injection,  ondansetron **OR** ondansetron (ZOFRAN) IV, oxyCODONE  Results for orders placed or performed during the hospital encounter of 10/04/14 (from the past 48 hour(s))  CBC with Differential     Status: Abnormal   Collection Time: 10/04/14  2:00 AM  Result Value Ref Range   WBC 16.4 (H) 4.0 - 10.5 K/uL   RBC 5.26 4.22 - 5.81 MIL/uL   Hemoglobin 16.0 13.0 - 17.0 g/dL   HCT 47.5 39.0 - 52.0 %   MCV 90.3 78.0 - 100.0 fL   MCH 30.4 26.0 - 34.0 pg   MCHC 33.7 30.0 - 36.0 g/dL   RDW 13.5 11.5 - 15.5 %   Platelets 285 150 - 400 K/uL   Neutrophils Relative % 75 43 - 77 %   Neutro Abs 12.4 (H) 1.7 - 7.7 K/uL   Lymphocytes Relative 15 12 - 46 %   Lymphs Abs 2.4 0.7 - 4.0 K/uL   Monocytes Relative 9 3 - 12 %   Monocytes Absolute 1.5 (H) 0.1 - 1.0 K/uL   Eosinophils Relative 1 0 - 5 %   Eosinophils Absolute 0.1 0.0 - 0.7 K/uL   Basophils Relative 0 0 - 1 %   Basophils Absolute 0.0 0.0 - 0.1 K/uL  Basic metabolic panel     Status: Abnormal   Collection Time: 10/04/14  2:00 AM  Result Value Ref Range   Sodium 135 135 - 145 mmol/L   Potassium 3.5 3.5 - 5.1 mmol/L   Chloride 100 (L) 101 - 111 mmol/L   CO2 26 22 -  32 mmol/L   Glucose, Bld 129 (H) 65 - 99 mg/dL   BUN 15 6 - 20 mg/dL   Creatinine, Ser 0.94 0.61 - 1.24 mg/dL   Calcium 9.1 8.9 - 10.3 mg/dL   GFR calc non Af Amer >60 >60 mL/min   GFR calc Af Amer >60 >60 mL/min    Comment: (NOTE) The eGFR has been calculated using the CKD EPI equation. This calculation has not been validated in all clinical situations. eGFR's persistently <60 mL/min signify possible Chronic Kidney Disease.    Anion gap 9 5 - 15  Culture, blood (routine x 2)     Status: None (Preliminary result)   Collection Time: 10/04/14  2:00 AM  Result Value Ref Range   Specimen Description BLOOD LEFT ANTECUBITAL    Special Requests BOTTLES DRAWN AEROBIC AND ANAEROBIC 6CC EACH    Culture PENDING    Report Status PENDING   Culture, blood (routine x 2)     Status: None  (Preliminary result)   Collection Time: 10/04/14  2:12 AM  Result Value Ref Range   Specimen Description BLOOD RIGHT ANTECUBITAL    Special Requests      BOTTLES DRAWN AEROBIC AND ANAEROBIC AEB 10CC ANA [place]   Culture PENDING    Report Status PENDING   Lactic acid, plasma     Status: None   Collection Time: 10/04/14  2:12 AM  Result Value Ref Range   Lactic Acid, Venous 1.2 0.5 - 2.0 mmol/L    Ct Maxillofacial W/cm  10/04/2014   CLINICAL DATA:  Acute onset of left cheek swelling and erythema. Initial encounter.  EXAM: CT MAXILLOFACIAL WITH CONTRAST  TECHNIQUE: Multidetector CT imaging of the maxillofacial structures was performed with intravenous contrast. Multiplanar CT image reconstructions were also generated. A small metallic BB was placed on the right temple in order to reliably differentiate right from left.  CONTRAST:  56m OMNIPAQUE IOHEXOL 300 MG/ML  SOLN  COMPARISON:  None.  FINDINGS: There is diffuse soft tissue inflammation and edema along the left side of the face, overlying the left maxilla and upper mandible. This appears to reflect a 1.3 x 0.7 cm soft tissue abscess overlying the left maxilla, due to diffuse erosion of the left second maxillary premolar. An underlying periapical abscess is seen, which appears to extend into the left maxillary sinus, with mucosal thickening at the left maxillary sinus.  Additional partial absence of multiple teeth is noted, at the left second maxillary molar, left first maxillary premolar, right first maxillary molar, left second mandibular molar, left second mandibular premolar, right first mandibular molar, and right second mandibular molar.  There is no evidence of fracture or dislocation. The maxilla and mandible appear intact. The nasal bone is unremarkable in appearance.  The orbits are intact bilaterally. The remaining visualized paranasal sinuses and mastoid air cells are well-aerated.  No significant soft tissue abnormalities are seen. The  parapharyngeal fat planes are preserved. The nasopharynx, oropharynx and hypopharynx are unremarkable in appearance. The visualized portions of the valleculae and piriform sinuses are grossly unremarkable.  The parotid and submandibular glands are within normal limits. No cervical lymphadenopathy is seen. Visualized cervical nodes remain borderline normal in size.  IMPRESSION: 1. Periapical abscess at the root of the left second maxillary premolar, with diffuse erosion of the left second maxillary premolar. Overlying 1.3 x 0.7 cm soft tissue abscess noted, with associated diffuse soft tissue inflammation and edema along the left side of the face. The periapical abscess appears to  extend into the left maxillary sinus, with mucosal thickening at the left maxillary sinus. 2. Additional partial absence of multiple teeth, as described above.   Electronically Signed   By: Garald Balding M.D.   On: 10/04/2014 03:13    Blood pressure 117/62, pulse 68, temperature 98.8 F (37.1 C), temperature source Oral, resp. rate 16, height 5' 11" (1.803 m), weight 61.372 kg (135 lb 4.8 oz), SpO2 100 %.  Physical Exam  Constitutional: He is oriented to person, place, and time. He appears well-developed and well-nourished.  Head: Normocephalic and atraumatic.  Swelling and mild erythema noted to the left cheek, poor dentition, broken tooth noted over the left upper premolar, tenderness to palpation along the gumline and cheek. No trismus. Eyes: Pupils are equal, round, and reactive to light.  Ears: Normal auricles and EACs. Nose: Normal mucosa and septum. Neck: Normal range of motion. Neck supple.  Musculoskeletal: He exhibits no edema.  Lymphadenopathy: He has no significant cervical adenopathy.  Neurological: He is alert and oriented to person, place, and time.  Skin: Skin is warm.  Psychiatric: He has a normal mood and affect.   Assessment/Plan: Odontogenic abscess with very poor dentitions. Continue IV abx. No  acute sinusitis. Only mild mucosal edema of the maxillary sinus noted on CT. Will need dental or oral surgey consult for dental extractions.  Alizee Maple,SUI W 10/04/2014, 8:45 AM

## 2014-10-05 DIAGNOSIS — A419 Sepsis, unspecified organism: Secondary | ICD-10-CM

## 2014-10-05 DIAGNOSIS — Z720 Tobacco use: Secondary | ICD-10-CM

## 2014-10-05 DIAGNOSIS — G4700 Insomnia, unspecified: Secondary | ICD-10-CM

## 2014-10-05 MED ORDER — IBUPROFEN 400 MG PO TABS
400.0000 mg | ORAL_TABLET | Freq: Three times a day (TID) | ORAL | Status: AC
  Administered 2014-10-05 – 2014-10-07 (×6): 400 mg via ORAL
  Filled 2014-10-05 (×2): qty 1
  Filled 2014-10-05: qty 2
  Filled 2014-10-05 (×4): qty 1

## 2014-10-05 NOTE — Progress Notes (Signed)
Patient called RN to bathroom. Patient had a bowel movement, blood seen on tissue after wiping. Patient states he was straining. NP on call notified, hemoccult ordered. Will continue to monitor.

## 2014-10-05 NOTE — Progress Notes (Signed)
Subjective: Pt c/o persistent left oral and facial pain.  Objective: Vital signs in last 24 hours: Temp:  [98 F (36.7 C)-98.5 F (36.9 C)] 98 F (36.7 C) (05/20 0735) Pulse Rate:  [64-74] 74 (05/20 0735) Resp:  [17-20] 20 (05/20 0735) BP: (95-118)/(56-61) 118/56 mmHg (05/20 0437) SpO2:  [97 %-99 %] 97 % (05/20 0735) Weight:  [59.875 kg (132 lb)] 59.875 kg (132 lb) (05/19 2028)  Physical Exam  Constitutional: He is oriented to person, place, and time. He appears well-developed and well-nourished.  Head: Normocephalic and atraumatic.  Face: Swelling and mild erythema noted to the left cheek, poor dentition, broken tooth noted over the left upper premolar, tenderness to palpation along the gumline and cheek.  Eyes: Pupils are equal, round, and reactive to light.  Ears: Normal auricles and EACs. Nose: Normal mucosa and septum. Neck: Normal range of motion. Neck supple.  Musculoskeletal: He exhibits no edema.  Neurological: He is alert and oriented to person, place, and time.  Skin: Skin is warm.  Psychiatric: He has a normal mood and affect.    Recent Labs  10/04/14 0200 10/04/14 0845  WBC 16.4* 13.8*  HGB 16.0 15.7  HCT 47.5 47.2  PLT 285 254    Recent Labs  10/04/14 0200 10/04/14 0845  NA 135 140  K 3.5 4.6  CL 100* 104  CO2 26 25  GLUCOSE 129* 99  BUN 15 9  CREATININE 0.94 0.90  CALCIUM 9.1 9.5    Medications:  I have reviewed the patient's current medications. Scheduled: . clindamycin (CLEOCIN) IV  600 mg Intravenous 3 times per day  . nicotine  7 mg Transdermal Daily   WUJ:[id number]:acetaminophen **OR** acetaminophen, alum & mag hydroxide-simeth, HYDROmorphone (DILAUDID) injection, ondansetron **OR** ondansetron (ZOFRAN) IV, oxyCODONE, zolpidem  Assessment/Plan: Odontogenic abscess and facial cellulitis. Appreciate Dr. Luretha MurphyKulinski's help. Scheduled to undergo dental extractions on Monday. Continue IV abx. No other acute intervention at this time.   LOS: 1 day    Donald Davies,SUI W 10/05/2014, 1:45 PM

## 2014-10-05 NOTE — Progress Notes (Signed)
[facility] PROGRESS NOTE  Donald DecampBrian Davies WUJ:[id number] DOB: 1981-03-27 DOA: 10/04/2014 PCP: No PCP Per Patient  Assessment/Plan: 1-dental abscess and facial cellulitis: dental surgeon consulted -plan is for surgery/extractions on Monday 5/23 -continue IV antibiotics -continue supportive care -to help with pain and inflammation will given 6 doses of ibuprofen schedule -apply warm compresses to affected area -continue IVF's to maintain hydration (patient eating/drinking poorly)  2-SIRS/early sepsis due to dental abscess/face cellulitis -features of severe infection improving/resolving  -will continue IV antibiotics and supportive care -WBC's trending down -currently afebrile  3-tobacco abuse: cessation counseling provided -patient started on nicoderm  4-insomnia: will use PRN ambien  DVT: SCD's and ambulation    Code Status: Full Family Communication: no family at bedside Disposition Plan: to OR on Monday for dental surgery, extractions and alveoplasty. Continue IV antibiotics and full liquid diet.   Consultants:  Dental surgeon (Dr. Kristin BruinsKulinski)  ENT  Procedures:  See below for x-ray reports   Antibiotics:  Clindamycin 5/19  HPI/Subjective: Currently afebrile. No CP or SOB. Patient reporting some chills and night sweats. Still with left facial swelling, trismus and mouth pain.  Objective: Filed Vitals:   10/05/14 2100  BP: 119/67  Pulse: 66  Temp: 98 F (36.7 C)  Resp: 18    Intake/Output Summary (Last 24 hours) at 10/05/14 2232 Last data filed at 10/05/14 1805  Gross per 24 hour  Intake   1260 ml  Output      2 ml  Net   1258 ml   Filed Weights   10/04/14 0101 10/04/14 0638 10/04/14 2028  Weight: 68.04 kg (150 lb) 61.372 kg (135 lb 4.8 oz) 59.875 kg (132 lb)    Exam:   General:  Afebrile, still with pain and left side facial swelling; patient unable to eat solids and having trouble even opening mouth to talk. Pain is somewhat  controlled. Had sensation of pus draining in his mouth earlier.  Cardiovascular: S1 and S2, no rubs or gallops  Respiratory: no wheezing, no crackles  Abdomen: soft, NT, ND, positive BS  Musculoskeletal: no joint swelling; FROM  Data Reviewed: Basic Metabolic Panel:  Recent Labs Lab 10/04/14 0200 10/04/14 0845  NA 135 140  K 3.5 4.6  CL 100* 104  CO2 26 25  GLUCOSE 129* 99  BUN 15 9  CREATININE 0.94 0.90  CALCIUM 9.1 9.5   CBC:  Recent Labs Lab 10/04/14 0200 10/04/14 0845  WBC 16.4* 13.8*  NEUTROABS 12.4*  --   HGB 16.0 15.7  HCT 47.5 47.2  MCV 90.3 89.4  PLT 285 254   CBG: No results for input(s): GLUCAP in the last 168 hours.  Recent Results (from the past 240 hour(s))  Culture, blood (routine x 2)     Status: None (Preliminary result)   Collection Time: 10/04/14  2:00 AM  Result Value Ref Range Status   Specimen Description BLOOD LEFT ANTECUBITAL  Final   Special Requests BOTTLES DRAWN AEROBIC AND ANAEROBIC 6CC EACH  Final   Culture NO GROWTH 1 DAY  Final   Report Status PENDING  Incomplete  Culture, blood (routine x 2)     Status: None (Preliminary result)   Collection Time: 10/04/14  2:12 AM  Result Value Ref Range Status   Specimen Description BLOOD RIGHT ANTECUBITAL  Final   Special Requests   Final    BOTTLES DRAWN AEROBIC AND ANAEROBIC AEB=10CC ANA=6CC   Culture NO GROWTH 1 DAY  Final   Report Status PENDING  Incomplete  Studies: Dg Orthopantogram  10/04/2014   CLINICAL DATA:  Dental abscess.  EXAMTresa Endo: ORTHOPANTOGRAM/PANORAMIC  COMPARISON:  10/04/2014 CT.  FINDINGS: Carious dentition is present. The largest area of cavity and apical osteolysis is at the seconds LEFT maxillary premolar. This corresponds abnormality identified on prior CT. Multiple dental cavities are present, some adjacent to fillings.  IMPRESSION: Orthopantomogram corroborates CT findings of carious dentition with periapical abscess at the LEFT second maxillary premolar.    Electronically Signed   By: Andreas NewportGeoffrey  Lamke M.D.   On: 10/04/2014 08:59   Ct Maxillofacial W/cm  10/04/2014   CLINICAL DATA:  Acute onset of left cheek swelling and erythema. Initial encounter.  EXAM: CT MAXILLOFACIAL WITH CONTRAST  TECHNIQUE: Multidetector CT imaging of the maxillofacial structures was performed with intravenous contrast. Multiplanar CT image reconstructions were also generated. A small metallic BB was placed on the right temple in order to reliably differentiate right from left.  CONTRAST:  80mL OMNIPAQUE IOHEXOL 300 MG/ML  SOLN  COMPARISON:  None.  FINDINGS: There is diffuse soft tissue inflammation and edema along the left side of the face, overlying the left maxilla and upper mandible. This appears to reflect a 1.3 x 0.7 cm soft tissue abscess overlying the left maxilla, due to diffuse erosion of the left second maxillary premolar. An underlying periapical abscess is seen, which appears to extend into the left maxillary sinus, with mucosal thickening at the left maxillary sinus.  Additional partial absence of multiple teeth is noted, at the left second maxillary molar, left first maxillary premolar, right first maxillary molar, left second mandibular molar, left second mandibular premolar, right first mandibular molar, and right second mandibular molar.  There is no evidence of fracture or dislocation. The maxilla and mandible appear intact. The nasal bone is unremarkable in appearance.  The orbits are intact bilaterally. The remaining visualized paranasal sinuses and mastoid air cells are well-aerated.  No significant soft tissue abnormalities are seen. The parapharyngeal fat planes are preserved. The nasopharynx, oropharynx and hypopharynx are unremarkable in appearance. The visualized portions of the valleculae and piriform sinuses are grossly unremarkable.  The parotid and submandibular glands are within normal limits. No cervical lymphadenopathy is seen. Visualized cervical nodes remain  borderline normal in size.  IMPRESSION: 1. Periapical abscess at the root of the left second maxillary premolar, with diffuse erosion of the left second maxillary premolar. Overlying 1.3 x 0.7 cm soft tissue abscess noted, with associated diffuse soft tissue inflammation and edema along the left side of the face. The periapical abscess appears to extend into the left maxillary sinus, with mucosal thickening at the left maxillary sinus. 2. Additional partial absence of multiple teeth, as described above.   Electronically Signed   By: Roanna RaiderJeffery  Chang M.D.   On: 10/04/2014 03:13    Scheduled Meds: . clindamycin (CLEOCIN) IV  600 mg Intravenous 3 times per day  . ibuprofen  400 mg Oral TID  . nicotine  7 mg Transdermal Daily   Continuous Infusions: . sodium chloride 100 mL/hr at 10/05/14 2023    Principal Problem:   Cellulitis and abscess of face Active Problems:   Facial abscess   Leukocytosis   Tobacco use disorder   DVT prophylaxis    Time spent: 30 minutes    Vassie LollMadera, Pessy Delamar  [facility] Pager [phone]. If 7PM-7AM, please contact night-coverage at [email or web address], password [email or web address] 10/05/2014, 10:32 PM  LOS: 1 day

## 2014-10-06 DIAGNOSIS — D72829 Elevated white blood cell count, unspecified: Secondary | ICD-10-CM

## 2014-10-06 LAB — BASIC METABOLIC PANEL
Anion gap: 5 (ref 5–15)
BUN: 6 mg/dL (ref 6–20)
CHLORIDE: 105 mmol/L (ref 101–111)
CO2: 31 mmol/L (ref 22–32)
Calcium: 9 mg/dL (ref 8.9–10.3)
Creatinine, Ser: 0.82 mg/dL (ref 0.61–1.24)
GFR calc Af Amer: >60 mL/min (ref >60)
GFR calc non Af Amer: >60 mL/min (ref >60)
Glucose, Bld: 114 mg/dL — ABNORMAL HIGH (ref 65–99)
POTASSIUM: 4.6 mmol/L (ref 3.5–5.1)
Sodium: 141 mmol/L (ref 135–145)

## 2014-10-06 LAB — CBC
HEMATOCRIT: 41.4 % (ref 39.0–52.0)
Hemoglobin: 13.7 g/dL (ref 13.0–17.0)
MCH: 29.8 pg (ref 26.0–34.0)
MCHC: 33.1 g/dL (ref 30.0–36.0)
MCV: 90.2 fL (ref 78.0–100.0)
Platelets: 224 10*3/uL (ref 150–400)
RBC: 4.59 MIL/uL (ref 4.22–5.81)
RDW: 13.7 % (ref 11.5–15.5)
WBC: 10.2 10*3/uL (ref 4.0–10.5)

## 2014-10-06 NOTE — Progress Notes (Signed)
[facility] PROGRESS NOTE  Donald Davies RUE:[id number] DOB: 1980-09-29 DOA: 10/04/2014 PCP: No PCP Per Patient  Assessment/Plan: 1-dental abscess and facial cellulitis: dental surgeon consulted -plan is for surgery/extractions on Monday 5/23 -continue IV antibiotics -continue supportive care -to help with pain and inflammation will continue anticipated schedule ibuprofen -continue use of warm compresses to affected area -continue IVF's to maintain hydration (patient eating/drinking better); will adjust rate  2-SIRS/early sepsis due to dental abscess/face cellulitis -features of severe infection improving/resolving  -will continue IV antibiotics and supportive care -WBC's essentially WNL now -has remained afebrile  3-tobacco abuse: cessation counseling provided -patient on nicoderm  4-insomnia: will continue using PRN ambien  DVT: SCD's and ambulation    Code Status: Full Family Communication: no family at bedside Disposition Plan: to OR on Monday for dental surgery, extractions and alveoplasty. Continue IV antibiotics and full liquid diet.   Consultants:  Dental surgeon (Dr. Kristin Bruins)  ENT  Procedures:  See below for x-ray reports   Antibiotics:  Clindamycin 5/19  HPI/Subjective: Currently afebrile. No CP or SOB. Swelling continue improving and pain is better control  Objective: Filed Vitals:   10/06/14 0829  BP: 101/55  Pulse: 59  Temp: 98.6 F (37 C)  Resp: 18    Intake/Output Summary (Last 24 hours) at 10/06/14 1643 Last data filed at 10/06/14 1512  Gross per 24 hour  Intake   3510 ml  Output      3 ml  Net   3507 ml   Filed Weights   10/04/14 0638 10/04/14 2028 10/05/14 2100  Weight: 61.372 kg (135 lb 4.8 oz) 59.875 kg (132 lb) 60 kg (132 lb 4.4 oz)    Exam:   General:  Afebrile, feeling better and with pain much better controlled. Asking to eat soft diet. swelling also improving.  Cardiovascular: S1 and S2, no rubs or  gallops  Respiratory: no wheezing, no crackles  Abdomen: soft, NT, ND, positive BS  Musculoskeletal: no joint swelling; FROM  Data Reviewed: Basic Metabolic Panel:  Recent Labs Lab 10/04/14 0200 10/04/14 0845 10/06/14 0429  NA 135 140 141  K 3.5 4.6 4.6  CL 100* 104 105  CO2 GLUCOSE 129* 99 114*  BUN CREATININE 0.94 0.90 0.82  CALCIUM 9.1 9.5 9.0   CBC:  Recent Labs Lab 10/04/14 0200 10/04/14 0845 10/06/14 0429  WBC 16.4* 13.8* 10.2  NEUTROABS 12.4*  --   --   HGB 16.0 15.7 13.7  HCT 47.5 47.2 41.4  MCV 90.3 89.4 90.2  PLT 285 254 224    Recent Results (from the past 240 hour(s))  Culture, blood (routine x 2)     Status: None (Preliminary result)   Collection Time: 10/04/14  2:00 AM  Result Value Ref Range Status   Specimen Description BLOOD LEFT ANTECUBITAL  Final   Special Requests BOTTLES DRAWN AEROBIC AND ANAEROBIC 6CC EACH  Final   Culture NO GROWTH 2 DAYS  Final   Report Status PENDING  Incomplete  Culture, blood (routine x 2)     Status: None (Preliminary result)   Collection Time: 10/04/14  2:12 AM  Result Value Ref Range Status   Specimen Description BLOOD RIGHT ANTECUBITAL  Final   Special Requests   Final    BOTTLES DRAWN AEROBIC AND ANAEROBIC AEB=10CC ANA=6CC   Culture NO GROWTH 2 DAYS  Final   Report Status PENDING  Incomplete     Studies: No results found.  Scheduled Meds: .  clindamycin (CLEOCIN) IV  600 mg Intravenous 3 times per day  . ibuprofen  400 mg Oral TID  . nicotine  7 mg Transdermal Daily   Continuous Infusions: . sodium chloride 100 mL/hr at 10/06/14 [id number]    Principal Problem:   Cellulitis and abscess of face Active Problems:   Facial abscess   Leukocytosis   Tobacco use disorder   DVT prophylaxis    Time spent: 30 minutes    Vassie LollMadera, Kerrilynn Derenzo  [facility] Pager [phone]. If 7PM-7AM, please contact night-coverage at [email or web address], password [email or web address] 10/06/2014, 4:43 PM  LOS: 2 days

## 2014-10-06 NOTE — Progress Notes (Signed)
Subjective: Pt still has significant left oral and facial pain.  Objective: Vital signs in last 24 hours: Temp:  [98 F (36.7 C)-98.6 F (37 C)] 98.6 F (37 C) (05/21 0829) Pulse Rate:  [59-81] 59 (05/21 0829) Resp:  [18-20] 18 (05/21 0829) BP: (101-120)/(55-75) 101/55 mmHg (05/21 0829) SpO2:  [98 %-100 %] 98 % (05/21 0829) Weight:  [60 kg (132 lb 4.4 oz)] 60 kg (132 lb 4.4 oz) (05/20 2100)  Physical Exam  Constitutional: He is oriented to person, place, and time. He appears well-developed and well-nourished.  Head: Normocephalic and atraumatic.  Face: Swelling and mild erythema noted to the left cheek, poor dentition, broken tooth noted over the left upper premolar, tenderness to palpation along the gumline and cheek.  Eyes: Pupils are equal, round, and reactive to light.  Ears: Normal auricles and EACs. Nose: Normal mucosa and septum. Neck: Normal range of motion. Neck supple.  Musculoskeletal: He exhibits no edema.  Neurological: He is alert and oriented to person, place, and time.  Skin: Skin is warm.  Psychiatric: He has a normal mood and affect.   Recent Labs  10/04/14 0845 10/06/14 0429  WBC 13.8* 10.2  HGB 15.7 13.7  HCT 47.2 41.4  PLT 254 224    Recent Labs  10/04/14 0845 10/06/14 0429  NA 140 141  K 4.6 4.6  CL 104 105  CO2 25 31  GLUCOSE 99 114*  BUN 9 6  CREATININE 0.90 0.82  CALCIUM 9.5 9.0    Medications:  I have reviewed the patient's current medications. Scheduled: . clindamycin (CLEOCIN) IV  600 mg Intravenous 3 times per day  . ibuprofen  400 mg Oral TID  . nicotine  7 mg Transdermal Daily    Assessment/Plan: Odontogenic abscess and facial cellulitis. WBC has normalized. Scheduled to undergo dental extractions on Monday. Continue IV abx.   LOS: 2 days   Beckem Tomberlin,SUI W 10/06/2014, 5:59 PM

## 2014-10-07 DIAGNOSIS — K5900 Constipation, unspecified: Secondary | ICD-10-CM

## 2014-10-07 MED ORDER — CEFAZOLIN SODIUM-DEXTROSE 2-3 GM-% IV SOLR
2.0000 g | INTRAVENOUS | Status: AC
  Administered 2014-10-08: 2 g via INTRAVENOUS

## 2014-10-07 MED ORDER — POLYETHYLENE GLYCOL 3350 17 G PO PACK
17.0000 g | PACK | Freq: Every day | ORAL | Status: DC
  Administered 2014-10-07 – 2014-10-09 (×2): 17 g via ORAL
  Filled 2014-10-07 (×3): qty 1

## 2014-10-07 MED ORDER — DOCUSATE SODIUM 100 MG PO CAPS
100.0000 mg | ORAL_CAPSULE | Freq: Two times a day (BID) | ORAL | Status: DC
  Administered 2014-10-07 – 2014-10-09 (×5): 100 mg via ORAL
  Filled 2014-10-07 (×9): qty 1

## 2014-10-07 NOTE — Progress Notes (Signed)
[facility] PROGRESS NOTE  Donald DecampBrian Tsosie BMW:[id number]:[id number] DOB: 1980/08/26 DOA: 10/04/2014 PCP: No PCP Per Patient  Assessment/Plan: 1-dental abscess and facial cellulitis: dental surgeon consulted -plan is for surgery/extractions on Monday 5/23 -continue IV antibiotics -continue supportive care -to help with pain and inflammation will continue anticipated schedule ibuprofen -continue use of warm compresses to affected area -continue IVF's to maintain hydration (patient eating/drinking better); will adjust rate  2-SIRS/early sepsis due to dental abscess/face cellulitis -features of severe infection essentially resolved -will continue IV antibiotics and supportive care -WBC's WNL now -has remained afebrile -dental extractions planned for tomorrow   3-tobacco abuse: cessation counseling provided -patient on nicoderm  4-insomnia: will continue using PRN ambien  5-constipation: miralax and colace will be started.  DVT: SCD's and ambulation   Code Status: Full Family Communication: no family at bedside Disposition Plan: to OR on Monday for dental surgery, extractions and alveoplasty. Continue IV antibiotics and full liquid diet.   Consultants:  Dental surgeon (Dr. Kristin BruinsKulinski)  ENT  Procedures:  See below for x-ray reports   Antibiotics:  Clindamycin 5/19  HPI/Subjective: Currently afebrile. No CP or SOB. Swelling, erythema and pain improved. Patient complaining of constipation.  Objective: Filed Vitals:   10/07/14 0846  BP: 102/56  Pulse: 58  Temp: 98 F (36.7 C)  Resp: 18    Intake/Output Summary (Last 24 hours) at 10/07/14 1456 Last data filed at 10/07/14 1440  Gross per 24 hour  Intake 4047.5 ml  Output      0 ml  Net 4047.5 ml   Filed Weights   10/04/14 2028 10/05/14 2100 10/06/14 2106  Weight: 59.875 kg (132 lb) 60 kg (132 lb 4.4 oz) 61.4 kg (135 lb 5.8 oz)    Exam:   General:  Afebrile, feeling better and with pain much better  controlled. Swelling has also improved. Tolerated soft diet.  Cardiovascular: S1 and S2, no rubs or gallops  Respiratory: no wheezing, no crackles  Abdomen: soft, NT, ND, positive BS  Musculoskeletal: no joint swelling; FROM  Data Reviewed: Basic Metabolic Panel:  Recent Labs Lab 10/04/14 0200 10/04/14 0845 10/06/14 0429  NA 135 140 141  K 3.5 4.6 4.6  CL 100* 104 105  CO2 26 25 31   GLUCOSE 129* 99 114*  BUN 15 9 6   CREATININE 0.94 0.90 0.82  CALCIUM 9.1 9.5 9.0   CBC:  Recent Labs Lab 10/04/14 0200 10/04/14 0845 10/06/14 0429  WBC 16.4* 13.8* 10.2  NEUTROABS 12.4*  --   --   HGB 16.0 15.7 13.7  HCT 47.5 47.2 41.4  MCV 90.3 89.4 90.2  PLT 285 254 224    Recent Results (from the past 240 hour(s))  Culture, blood (routine x 2)     Status: None (Preliminary result)   Collection Time: 10/04/14  2:00 AM  Result Value Ref Range Status   Specimen Description BLOOD LEFT ANTECUBITAL  Final   Special Requests BOTTLES DRAWN AEROBIC AND ANAEROBIC 6CC EACH  Final   Culture NO GROWTH 3 DAYS  Final   Report Status PENDING  Incomplete  Culture, blood (routine x 2)     Status: None (Preliminary result)   Collection Time: 10/04/14  2:12 AM  Result Value Ref Range Status   Specimen Description BLOOD RIGHT ANTECUBITAL  Final   Special Requests   Final    BOTTLES DRAWN AEROBIC AND ANAEROBIC AEB=10CC ANA=6CC   Culture NO GROWTH 3 DAYS  Final   Report Status PENDING  Incomplete  Studies: No results found.  Scheduled Meds: . clindamycin (CLEOCIN) IV  600 mg Intravenous 3 times per day  . docusate sodium  100 mg Oral BID  . ibuprofen  400 mg Oral TID  . nicotine  7 mg Transdermal Daily  . polyethylene glycol  17 g Oral Daily   Continuous Infusions: . sodium chloride 75 mL/hr (10/07/14 0054)    Principal Problem:   Cellulitis and abscess of face Active Problems:   Facial abscess   Leukocytosis   Tobacco use disorder   DVT prophylaxis    Time spent: 30  minutes    Vassie Loll  [facility] Pager [phone]. If 7PM-7AM, please contact night-coverage at [email or web address], password [email or web address] 10/07/2014, 2:56 PM  LOS: 3 days

## 2014-10-07 NOTE — Progress Notes (Signed)
Subjective: Pt reports improvement in his facial and oral pain.  Objective: Vital signs in last 24 hours: Temp:  [97.9 F (36.6 C)-98.5 F (36.9 C)] 98 F (36.7 C) (05/22 0846) Pulse Rate:  [56-83] 58 (05/22 0846) Resp:  [18-19] 18 (05/22 0846) BP: (95-115)/(50-71) 102/56 mmHg (05/22 0846) SpO2:  [99 %-100 %] 99 % (05/22 0846) Weight:  [61.4 kg (135 lb 5.8 oz)] 61.4 kg (135 lb 5.8 oz) (05/21 2106)  Physical Exam  Constitutional: He is oriented to person, place, and time. He appears well-developed and well-nourished.  Head: Normocephalic and atraumatic.  Face: Facial swelling and erythema has decreased. Poor dentition, broken tooth noted over the left upper premolar, tenderness to palpation along the gumline and cheek.  Eyes: Pupils are equal, round, and reactive to light.  Ears: Normal auricles and EACs. Nose: Normal mucosa and septum. Neck: Normal range of motion. Neck supple.  Musculoskeletal: He exhibits no edema.  Neurological: He is alert and oriented to person, place, and time.  Skin: Skin is warm.  Psychiatric: He has a normal mood and affect.   Recent Labs  10/06/14 0429  WBC 10.2  HGB 13.7  HCT 41.4  PLT 224    Recent Labs  10/06/14 0429  NA 141  K 4.6  CL 105  CO2 31  GLUCOSE 114*  BUN 6  CREATININE 0.82  CALCIUM 9.0    Medications:  I have reviewed the patient's current medications. Scheduled: . clindamycin (CLEOCIN) IV  600 mg Intravenous 3 times per day  . ibuprofen  400 mg Oral TID  . nicotine  7 mg Transdermal Daily   ZOX:[id number]:acetaminophen **OR** acetaminophen, alum & mag hydroxide-simeth, HYDROmorphone (DILAUDID) injection, ondansetron **OR** ondansetron (ZOFRAN) IV, oxyCODONE, zolpidem  Assessment/Plan: Odontogenic abscess and facial cellulitis. Improved with IV clindamycin. Scheduled to undergo dental extractions on Monday. Continue IV abx.   LOS: 3 days   Hillery Bhalla,SUI W 10/07/2014, 2:18 PM

## 2014-10-08 ENCOUNTER — Encounter (HOSPITAL_COMMUNITY)
Admission: EM | Disposition: A | Discharge status: Home / Self Care | Payer: Self-pay | Source: Home / Self Care | Attending: Internal Medicine

## 2014-10-08 ENCOUNTER — Inpatient Hospital Stay (HOSPITAL_COMMUNITY): Payer: Self-pay | Admitting: Anesthesiology

## 2014-10-08 ENCOUNTER — Inpatient Hospital Stay (HOSPITAL_COMMUNITY): Payer: MEDICAID | Admitting: Anesthesiology

## 2014-10-08 DIAGNOSIS — K0530 Chronic periodontitis, unspecified: Secondary | ICD-10-CM

## 2014-10-08 DIAGNOSIS — K029 Dental caries, unspecified: Secondary | ICD-10-CM

## 2014-10-08 DIAGNOSIS — K045 Chronic apical periodontitis: Secondary | ICD-10-CM | POA: Diagnosis present

## 2014-10-08 HISTORY — PX: MULTIPLE EXTRACTIONS WITH ALVEOLOPLASTY: SHX5342

## 2014-10-08 LAB — BASIC METABOLIC PANEL
Anion gap: 8 (ref 5–15)
BUN: 6 mg/dL (ref 6–20)
CALCIUM: 8.9 mg/dL (ref 8.9–10.3)
CO2: 27 mmol/L (ref 22–32)
CREATININE: 0.9 mg/dL (ref 0.61–1.24)
Chloride: 105 mmol/L (ref 101–111)
GFR calc Af Amer: >60 mL/min (ref >60)
GFR calc non Af Amer: >60 mL/min (ref >60)
Glucose, Bld: 95 mg/dL (ref 65–99)
POTASSIUM: 4.2 mmol/L (ref 3.5–5.1)
Sodium: 140 mmol/L (ref 135–145)

## 2014-10-08 LAB — SURGICAL PCR SCREEN
MRSA, PCR: NEGATIVE
Staphylococcus aureus: NEGATIVE

## 2014-10-08 LAB — CBC
HCT: 41.4 % (ref 39.0–52.0)
HEMOGLOBIN: 13.7 g/dL (ref 13.0–17.0)
MCH: 29.5 pg (ref 26.0–34.0)
MCHC: 33.1 g/dL (ref 30.0–36.0)
MCV: 89.2 fL (ref 78.0–100.0)
Platelets: 240 10*3/uL (ref 150–400)
RBC: 4.64 MIL/uL (ref 4.22–5.81)
RDW: 13.6 % (ref 11.5–15.5)
WBC: 7.9 10*3/uL (ref 4.0–10.5)

## 2014-10-08 SURGERY — MULTIPLE EXTRACTION WITH ALVEOLOPLASTY
Anesthesia: General | Site: Mouth

## 2014-10-08 MED ORDER — NEOSTIGMINE METHYLSULFATE 10 MG/10ML IV SOLN
INTRAVENOUS | Status: DC | PRN
  Administered 2014-10-08: 3 mg via INTRAVENOUS

## 2014-10-08 MED ORDER — LIDOCAINE-EPINEPHRINE 2 %-1:100000 IJ SOLN
INTRAMUSCULAR | Status: AC
  Filled 2014-10-08: qty 10.2

## 2014-10-08 MED ORDER — PROPOFOL 10 MG/ML IV BOLUS
INTRAVENOUS | Status: DC | PRN
  Administered 2014-10-08: 50 mg via INTRAVENOUS
  Administered 2014-10-08: 150 mg via INTRAVENOUS

## 2014-10-08 MED ORDER — LACTATED RINGERS IV SOLN
INTRAVENOUS | Status: DC | PRN
  Administered 2014-10-08: 07:00:00 via INTRAVENOUS

## 2014-10-08 MED ORDER — FENTANYL CITRATE (PF) 100 MCG/2ML IJ SOLN
INTRAMUSCULAR | Status: DC | PRN
  Administered 2014-10-08: 100 ug via INTRAVENOUS
  Administered 2014-10-08 (×3): 50 ug via INTRAVENOUS

## 2014-10-08 MED ORDER — HYDROMORPHONE HCL 1 MG/ML IJ SOLN
INTRAMUSCULAR | Status: AC
  Administered 2014-10-08: 0.5 mg via INTRAVENOUS
  Filled 2014-10-08: qty 1

## 2014-10-08 MED ORDER — GLYCOPYRROLATE 0.2 MG/ML IJ SOLN
INTRAMUSCULAR | Status: AC
  Filled 2014-10-08: qty 2

## 2014-10-08 MED ORDER — LIDOCAINE HCL (CARDIAC) 20 MG/ML IV SOLN
INTRAVENOUS | Status: AC
  Filled 2014-10-08: qty 5

## 2014-10-08 MED ORDER — ONDANSETRON HCL 4 MG/2ML IJ SOLN
INTRAMUSCULAR | Status: DC | PRN
  Administered 2014-10-08: 4 mg via INTRAVENOUS

## 2014-10-08 MED ORDER — ROCURONIUM BROMIDE 100 MG/10ML IV SOLN
INTRAVENOUS | Status: DC | PRN
  Administered 2014-10-08: 20 mg via INTRAVENOUS

## 2014-10-08 MED ORDER — ONDANSETRON HCL 4 MG/2ML IJ SOLN
INTRAMUSCULAR | Status: AC
  Filled 2014-10-08: qty 2

## 2014-10-08 MED ORDER — SUCCINYLCHOLINE CHLORIDE 20 MG/ML IJ SOLN
INTRAMUSCULAR | Status: DC | PRN
  Administered 2014-10-08: 100 mg via INTRAVENOUS

## 2014-10-08 MED ORDER — NEOSTIGMINE METHYLSULFATE 10 MG/10ML IV SOLN
INTRAVENOUS | Status: AC
  Filled 2014-10-08: qty 1

## 2014-10-08 MED ORDER — BUPIVACAINE-EPINEPHRINE 0.5% -1:200000 IJ SOLN
INTRAMUSCULAR | Status: DC | PRN
  Administered 2014-10-08: 1.7 mL

## 2014-10-08 MED ORDER — ROCURONIUM BROMIDE 50 MG/5ML IV SOLN
INTRAVENOUS | Status: AC
  Filled 2014-10-08: qty 1

## 2014-10-08 MED ORDER — MEPERIDINE HCL 25 MG/ML IJ SOLN: 6.2500 mg | INTRAMUSCULAR | Status: DC | PRN

## 2014-10-08 MED ORDER — BUPIVACAINE-EPINEPHRINE (PF) 0.5% -1:200000 IJ SOLN
INTRAMUSCULAR | Status: AC
  Filled 2014-10-08: qty 1.8

## 2014-10-08 MED ORDER — HYDROMORPHONE HCL 1 MG/ML IJ SOLN
0.2500 mg | INTRAMUSCULAR | Status: DC | PRN
  Administered 2014-10-08 (×2): 0.5 mg via INTRAVENOUS

## 2014-10-08 MED ORDER — LIDOCAINE HCL (CARDIAC) 20 MG/ML IV SOLN
INTRAVENOUS | Status: DC | PRN
  Administered 2014-10-08: 60 mg via INTRAVENOUS

## 2014-10-08 MED ORDER — 0.9 % SODIUM CHLORIDE (POUR BTL) OPTIME
TOPICAL | Status: DC | PRN
  Administered 2014-10-08: 1000 mL

## 2014-10-08 MED ORDER — MIDAZOLAM HCL 5 MG/5ML IJ SOLN
INTRAMUSCULAR | Status: DC | PRN
  Administered 2014-10-08: 2 mg via INTRAVENOUS

## 2014-10-08 MED ORDER — OXYMETAZOLINE HCL 0.05 % NA SOLN
NASAL | Status: AC
  Filled 2014-10-08: qty 15

## 2014-10-08 MED ORDER — ONDANSETRON HCL 4 MG/2ML IJ SOLN: 4.0000 mg | Freq: Once | INTRAMUSCULAR | Status: DC | PRN

## 2014-10-08 MED ORDER — DEXAMETHASONE SODIUM PHOSPHATE 4 MG/ML IJ SOLN
INTRAMUSCULAR | Status: AC
  Filled 2014-10-08: qty 1

## 2014-10-08 MED ORDER — LIDOCAINE-EPINEPHRINE 2 %-1:100000 IJ SOLN
INTRAMUSCULAR | Status: DC | PRN
  Administered 2014-10-08: 1.7 mL

## 2014-10-08 MED ORDER — PROPOFOL 10 MG/ML IV BOLUS
INTRAVENOUS | Status: AC
  Filled 2014-10-08: qty 20

## 2014-10-08 MED ORDER — DEXAMETHASONE SODIUM PHOSPHATE 4 MG/ML IJ SOLN
INTRAMUSCULAR | Status: DC | PRN
  Administered 2014-10-08: 4 mg via INTRAVENOUS

## 2014-10-08 MED ORDER — FENTANYL CITRATE (PF) 250 MCG/5ML IJ SOLN
INTRAMUSCULAR | Status: AC
  Filled 2014-10-08: qty 5

## 2014-10-08 MED ORDER — MIDAZOLAM HCL 2 MG/2ML IJ SOLN
INTRAMUSCULAR | Status: AC
  Filled 2014-10-08: qty 2

## 2014-10-08 MED ORDER — LACTATED RINGERS IV SOLN: INTRAVENOUS | Status: DC

## 2014-10-08 MED ORDER — GLYCOPYRROLATE 0.2 MG/ML IJ SOLN
INTRAMUSCULAR | Status: DC | PRN
  Administered 2014-10-08: 0.4 mg via INTRAVENOUS

## 2014-10-08 SURGICAL SUPPLY — 34 items
ALCOHOL 70% 16 OZ (MISCELLANEOUS) ×3 IMPLANT
ATTRACTOMAT 16X20 MAGNETIC DRP (DRAPES) ×3 IMPLANT
BLADE SURG 15 STRL LF DISP TIS (BLADE) ×2 IMPLANT
BLADE SURG 15 STRL SS (BLADE) ×6
COVER SURGICAL LIGHT HANDLE (MISCELLANEOUS) ×3 IMPLANT
GAUZE PACKING FOLDED 2  STR (GAUZE/BANDAGES/DRESSINGS) ×2
GAUZE PACKING FOLDED 2 STR (GAUZE/BANDAGES/DRESSINGS) ×1 IMPLANT
GAUZE SPONGE 4X4 16PLY XRAY LF (GAUZE/BANDAGES/DRESSINGS) ×3 IMPLANT
GLOVE BIOGEL PI IND STRL 6 (GLOVE) ×1 IMPLANT
GLOVE BIOGEL PI INDICATOR 6 (GLOVE) ×2
GLOVE SURG ORTHO 8.0 STRL STRW (GLOVE) ×3 IMPLANT
GLOVE SURG SS PI 6.0 STRL IVOR (GLOVE) ×3 IMPLANT
GOWN STRL REUS W/ TWL LRG LVL3 (GOWN DISPOSABLE) ×1 IMPLANT
GOWN STRL REUS W/TWL 2XL LVL3 (GOWN DISPOSABLE) ×3 IMPLANT
GOWN STRL REUS W/TWL LRG LVL3 (GOWN DISPOSABLE) ×3
HEMOSTAT SURGICEL 2X14 (HEMOSTASIS) ×3 IMPLANT
KIT BASIN OR (CUSTOM PROCEDURE TRAY) ×3 IMPLANT
KIT ROOM TURNOVER OR (KITS) ×3 IMPLANT
MANIFOLD NEPTUNE WASTE (CANNULA) ×3 IMPLANT
NDL BLUNT 16X1.5 OR ONLY (NEEDLE) ×1 IMPLANT
NEEDLE BLUNT 16X1.5 OR ONLY (NEEDLE) ×3 IMPLANT
NS IRRIG 1000ML POUR BTL (IV SOLUTION) ×3 IMPLANT
PACK EENT II TURBAN DRAPE (CUSTOM PROCEDURE TRAY) ×3 IMPLANT
PAD ARMBOARD 7.5X6 YLW CONV (MISCELLANEOUS) ×3 IMPLANT
SPONGE SURGIFOAM ABS GEL 100 (HEMOSTASIS) IMPLANT
SPONGE SURGIFOAM ABS GEL 12-7 (HEMOSTASIS) IMPLANT
SPONGE SURGIFOAM ABS GEL SZ50 (HEMOSTASIS) IMPLANT
SUCTION FRAZIER TIP 10 FR DISP (SUCTIONS) ×3 IMPLANT
SUT CHROMIC 3 0 PS 2 (SUTURE) ×10 IMPLANT
SYR 50ML SLIP (SYRINGE) ×3 IMPLANT
TOWEL OR 17X26 10 PK STRL BLUE (TOWEL DISPOSABLE) ×3 IMPLANT
TUBE CONNECTING 12'X1/4 (SUCTIONS) ×1
TUBE CONNECTING 12X1/4 (SUCTIONS) ×2 IMPLANT
YANKAUER SUCT BULB TIP NO VENT (SUCTIONS) ×3 IMPLANT

## 2014-10-08 NOTE — Transfer of Care (Signed)
Immediate Anesthesia Transfer of Care Note  Patient: Donald DecampBrian Dinan  Procedure(s) Performed: Procedure(s): Extraction of tooth #'s [phone],3,12,14,15,16 [phone],18,30,31,32 with alveoloplasty (N/A)  Patient Location: PACU  Anesthesia Type:General  Level of Consciousness: awake, alert  and oriented  Airway & Oxygen Therapy: Patient Spontanous Breathing and Patient connected to face mask oxygen  Post-op Assessment: Report given to RN, Post -op Vital signs reviewed and stable and Patient moving all extremities X 4  Post vital signs: Reviewed and stable  Last Vitals:  Filed Vitals:   10/08/14 0526  BP: 116/73  Pulse: 57  Temp: 36.8 C  Resp: 16    Complications: No apparent anesthesia complications

## 2014-10-08 NOTE — Discharge Instructions (Signed)

## 2014-10-08 NOTE — Op Note (Signed)
OPERATIVE REPORT  Patient:            Donald DecampBrian Davies Date of Birth:  06/05/1980 MRN:                [id number]   DATE OF PROCEDURE:  10/08/2014  PREOPERATIVE DIAGNOSES: 1.  History of left facial swelling 2. Acute pulpitis 3. Chronic apical periodontitis 4. Rampant dental caries 5. Chronic periodontitis  POSTOPERATIVE DIAGNOSES: 1. History of left facial swelling 2. Acute pulpitis 3. Chronic apical periodontitis 4. Rampant dental caries 5. Chronic periodontitis  OPERATIONS: 1. Multiple extraction of tooth numbers 2, 3, 12, 14, 15, 16, 17, 18, 30, 31, and 32 2. 4 Quadrants of alveoloplasty   SURGEON: Charlynne Panderonald F. Kulinski, DDS  ASSISTANT: Rory PercyLisa Ingram, (dental assistant)  ANESTHESIA: General anesthesia via nasoendotracheal tube.  MEDICATIONS: 1. Ancef 2 g IV prior to invasive dental procedures. 2. Local anesthesia with a total utilization of 6 carpules each containing 34 mg of lidocaine with 0.017 mg of epinephrine as well as 2 carpules each containing 9 mg of bupivacaine with 0.009 mg of epinephrine.  SPECIMENS: There are 11 teeth that were discarded.  DRAINS: None  CULTURES: None  COMPLICATIONS: None   ESTIMATED BLOOD LOSS: 100 mLs.  INTRAVENOUS FLUIDS: 800 mLs of Lactated ringers solution.  INDICATIONS: The patient was recently diagnosed with left facial swelling and acute pulpitis.  A dental consultation was then requested to evaluate poor dentition.  The patient was examined and treatment planned for extraction of indicated teeth with alveoloplasty.  It was determined that tooth numbers 2, 3, 12, 14, 15, 16, 17, 18, 30, 31, and 32 would be extracted at this time. As per previous discussion with the patient, the patient did not wish to have tooth #20 extracted at this time. This treatment plan was formulated to decrease the risks and complications associated with dental infection from further affecting the patient's systemic health.  OPERATIVE  FINDINGS: Patient was examined operating room number 11.  The teeth were identified for extraction. The patient was noted be affected by chronic periodontitis, chronic apical periodontitis, history of trismus, history of acute pulpitis, rampant dental caries, and tooth mobility.   DESCRIPTION OF PROCEDURE: Patient was brought to the main operating room number 11. Patient was then placed in the supine position on the operating table. General anesthesia was then induced per the anesthesia team. The patient was then prepped and draped in the usual manner for dental medicine procedure. A timeout was performed. The patient was identified and procedures were verified. A throat pack was placed at this time. The oral cavity was then thoroughly examined with the findings noted above. The patient was then ready for dental medicine procedure as follows:  Local anesthesia was then administered sequentially with a total utilization of 6 carpules each containing 34 mg of lidocaine with 0.017 mg of epinephrine as well as 2 carpules  each containing 9 mg bupivacaine with 0.009 mg of epinephrine.  The Maxillary left and right quadrants first approached. Anesthesia was then delivered utilizing infiltration with lidocaine with epinephrine. A #15 blade incision was then made from the maxillary right tuberosity and extended to the mesial #4.  A  surgical flap was then carefully reflected. Tooth numbers 2 and 3 were subluxated with a series straight elevators. Tooth numbers 2 and 3 were then removed with a 53R forceps without complications. Alveoloplasty was then performed utilizing a rongeur and bone file. The tissues were approximated and trimmed appropriately. The surgical site was irrigated  with copious amounts sterile saline. The surgical site was then closed from the maxillary right tuberosity and extended to the distal of #5 utilizing 3-0 chromic gut suture in a continuous interrupted suture technique 1.  The maxillary  left quadrant was then approached. A 15 blade incision was then made from the distal of the tuberosity and extended to the distal of #11. A surgical flap was then carefully reflected. Appropriate amounts of buccal and interseptal bone were then removed utilizing a surgical handpiece and bur and copious amounts of sterile water.  The teeth were then subluxated with a series of straight elevators. Tooth numbers 16, 15, 14 were then removed with a 53L  forceps without complications.  Tooth #12 was then removed with a 150 forceps without complications. Alveoloplasty was then performed utilizing a ronguers and bone file. The surgical site was then irrigated with copious amounts of sterile saline. The tissues were approximated and trimmed appropriately. The surgical site was then closed from the maxillary left tuberosity and extended to the distal of #11 utilizing 3-0 chromic gut suture in a continuous interrupted suture technique 1.   At this point time, the mandibular quadrants were approached. The patient was given bilateral inferior alveolar nerve blocks and long buccal nerve blocks utilizing the bupivacaine with epinephrine. Further infiltration was then achieved utilizing the lidocaine with epinephrine. A 15 blade incision was then made from the distal of number 32 and extended to the mesial of #29.  A surgical flap was then carefully reflected. Appropriate amounts of buccal and interseptal bone were then removed utilizing a surgical handpiece and copious amount of sterile water around tooth numbers 30, 31, and 32.   The lower right teeth were then subluxated with a series of straight elevators. Tooth numbers  31, and 30  were then  removed with a 23 forceps without complications. Tooth #32 was then removed with a 151 forceps without complications. Alveoloplasty was then performed utilizing a rongeurs and bone file.  At this point in time, the flap was further reflected on the lingual aspect to remove the  undercut alveolar ridge bone with a surgical handpiece and bur and copious amounts of sterile water. Alveoloplasty was then performed to the site with a rongeur and bone file. The tissues were approximated and trimmed appropriately. The surgical sites were then irrigated with copious amounts of sterile saline. The mandibular right surgical site was then closed from the distal of  32 and extended to the mesial of #30 utilizing 3-0 chromic gut suture in a continuous interrupted suture technique 1.   At this point time the mandibular left surgical site was approached. A 15 blade incision was made from the distal of #17 and extended to the distal of #20.  A surgical flap was then reflected carefully. Appropriate amounts of buccal and interseptal bone was then removed with a surgical handpiece and bur and copious amounts sterile water around tooth numbers 17 and 18.  Tooth #17 and 18 were then subluxated with a series straight elevators. Tooth numbers 17 and 18 were then removed with a 23 forceps without complications. Alveoloplasty some performed utilizing a rongeur and bone file. The flap was then further reflected on the lingual aspect to expose the undercut alveolar ridge. The alveolar ridge was then reduced utilizing a surgical handpiece and bur and copious amounts sterile water. Further alveoloplasty was then performed utilizing a rongeur and bone file. The surgical site was then irrigated with copious postural saline. The tissues were approximated and  trimmed appropriately.  The mandibular right surgical site was then closed from the distal of17 and extended to the distal of #20 utilizing 3-0 chromic gut suture in a continuous interrupted suture technique 1.    At this point time, the entire mouth was irrigated with copious amounts of sterile saline. The patient was examined for complications, seeing none, the dental medicine procedure was deemed to be complete. The throat pack was removed at this time. An  oral airway was then placed at the request of the anesthesia team. A series of 4 x 4 gauze were placed in the mouth to aid hemostasis. The patient was then handed over to the anesthesia team for final disposition. After an appropriate amount of time, the patient was extubated and taken to the postanesthsia care unit in good condition. All counts were correct for the dental medicine procedure.  The patient is a follow-up with dental medicine in 7-10 days for evaluation for suture removal. The patient may be discharged by the medical team as deemed appropriate.   Charlynne Pander, DDS.

## 2014-10-08 NOTE — Anesthesia Procedure Notes (Signed)
Procedure Name: Intubation Date/Time: 10/08/2014 7:37 AM Performed by: Quentin OreWALKER, Nevia Henkin E Pre-anesthesia Checklist: Patient identified, Emergency Drugs available, Suction available, Patient being monitored and Timeout performed Patient Re-evaluated:Patient Re-evaluated prior to inductionOxygen Delivery Method: Circle system utilized Preoxygenation: Pre-oxygenation with 100% oxygen Intubation Type: IV induction Ventilation: Mask ventilation without difficulty Laryngoscope Size: Mac and 3 Grade View: Grade I Nasal Tubes: Left, Nasal Rae and Magill forceps - small, utilized Tube size: 7.0 mm Number of attempts: 2 Placement Confirmation: ETT inserted through vocal cords under direct vision,  positive ETCO2 and breath sounds checked- equal and bilateral Tube secured with: Tape Dental Injury: Bloody posterior oropharynx  Comments: DLx1 with MAC 3, Grade I view per CRNA. Unable to advance ETT thru vocal cords. 2nd attempt per MDA, successful intubation with 7.0 nasal ETT. +ETCO2 & BBS=.

## 2014-10-08 NOTE — Progress Notes (Signed)
PRE-OPERATIVE NOTE:  10/08/2014   Donald Davies [id number]  VITALS: BP 116/73 mmHg  Pulse 57  Temp(Src) 98.2 F (36.8 C) (Oral)  Resp 16  Ht 5\' 11"  (1.803 m)  Wt 137 lb 2 oz (62.2 kg)  BMI 19.13 kg/m2  SpO2 99%  Lab Results  Component Value Date   WBC 7.9 10/08/2014   HGB 13.7 10/08/2014   HCT 41.4 10/08/2014   MCV 89.2 10/08/2014   PLT 240 10/08/2014   BMET    Component Value Date/Time   NA 140 10/08/2014 0555   K 4.2 10/08/2014 0555   CL 105 10/08/2014 0555   CO2 27 10/08/2014 0555   GLUCOSE 95 10/08/2014 0555   BUN 6 10/08/2014 0555   CREATININE 0.90 10/08/2014 0555   CALCIUM 8.9 10/08/2014 0555   GFRNONAA >60 10/08/2014 0555   GFRAA >60 10/08/2014 0555    No results found for: INR, PROTIME No results found for: PTT   Donald Davies presents for multiple dental extractions with alveoloplasty in the operating with general anesthesia.   SUBJECTIVE: The patient denies any acute medical or dental changes and agrees to proceed with treatment as planned.  EXAM: Patient has decreased left facial swelling and significant increase in the maximum interincisal opening now measured at approximately 40 mm.   ASSESSMENT: Patient is affected by history of left facial swelling, acute pulpitis, history of trismus, chronic apical periodontitis, dental caries, and chronic periodontitis.  PLAN: Patient agrees to proceed with treatment as planned in the operating room as previously discussed and accepts the risks, benefits, and complications of the proposed treatment. Patient is aware of the risk for bleeding, bruising, swelling, infection, pain, nerve damage, soft tissue damage, damage to adjacent teeth, sinus involvement, root tip fracture, mandible fracture, and the risks of complications associated with the anesthesia. Patient also is aware of the potential for other complications not mentioned above.   Charlynne Panderonald F. Lisanne Ponce, DDS

## 2014-10-08 NOTE — Anesthesia Preprocedure Evaluation (Addendum)
Anesthesia Evaluation  Patient identified by MRN, date of birth, ID band Patient awake    Reviewed: Allergy & Precautions, NPO status , Patient's Chart, lab work & pertinent test results  Airway Mallampati: II  TM Distance: >3 FB Neck ROM: Full    Dental  (+) Teeth Intact   Pulmonary Current Smoker,    Pulmonary exam normal       Cardiovascular Normal cardiovascular exam    Neuro/Psych    GI/Hepatic   Endo/Other    Renal/GU      Musculoskeletal   Abdominal   Peds  Hematology   Anesthesia Other Findings   Reproductive/Obstetrics                           Anesthesia Physical Anesthesia Plan  ASA: II  Anesthesia Plan: General   Post-op Pain Management:    Induction: Intravenous  Airway Management Planned: Nasal ETT  Additional Equipment:   Intra-op Plan:   Post-operative Plan: Extubation in OR  Informed Consent: I have reviewed the patients History and Physical, chart, labs and discussed the procedure including the risks, benefits and alternatives for the proposed anesthesia with the patient or authorized representative who has indicated his/her understanding and acceptance.   Dental advisory given  Plan Discussed with: CRNA and Surgeon  Anesthesia Plan Comments:        Anesthesia Quick Evaluation

## 2014-10-08 NOTE — Anesthesia Postprocedure Evaluation (Signed)
Anesthesia Post Note  Patient: Donald DecampBrian Davies  Procedure(s) Performed: Procedure(s) (LRB): Extraction of tooth #'s [phone],3,12,14,15,16 [phone],18,30,31,32 with alveoloplasty (N/A)  Anesthesia type: general  Patient location: PACU  Post pain: Pain level controlled  Post assessment: Patient's Cardiovascular Status Stable  Last Vitals:  Filed Vitals:   10/08/14 1000  BP: 120/70  Pulse: 78  Temp:   Resp: 9    Post vital signs: Reviewed and stable  Level of consciousness: sedated  Complications: No apparent anesthesia complications

## 2014-10-09 ENCOUNTER — Encounter (HOSPITAL_COMMUNITY): Payer: Self-pay | Admitting: Dentistry

## 2014-10-09 DIAGNOSIS — K045 Chronic apical periodontitis: Secondary | ICD-10-CM

## 2014-10-09 DIAGNOSIS — K047 Periapical abscess without sinus: Secondary | ICD-10-CM | POA: Insufficient documentation

## 2014-10-09 DIAGNOSIS — K5900 Constipation, unspecified: Secondary | ICD-10-CM | POA: Insufficient documentation

## 2014-10-09 DIAGNOSIS — G4700 Insomnia, unspecified: Secondary | ICD-10-CM | POA: Insufficient documentation

## 2014-10-09 LAB — CULTURE, BLOOD (ROUTINE X 2)
Culture: NO GROWTH
Culture: NO GROWTH

## 2014-10-09 MED ORDER — NICOTINE 7 MG/24HR TD PT24: 7.0000 mg | MEDICATED_PATCH | Freq: Every day | TRANSDERMAL | Status: DC

## 2014-10-09 MED ORDER — POLYETHYLENE GLYCOL 3350 17 G PO PACK: 17.0000 g | PACK | Freq: Every day | ORAL | Status: DC

## 2014-10-09 MED ORDER — HYDROCODONE-ACETAMINOPHEN 5-325 MG PO TABS: 1.0000 | ORAL_TABLET | Freq: Four times a day (QID) | ORAL | Status: DC | PRN

## 2014-10-09 MED ORDER — CLINDAMYCIN HCL 300 MG PO CAPS: 300.0000 mg | ORAL_CAPSULE | Freq: Three times a day (TID) | ORAL | Status: AC

## 2014-10-09 NOTE — Progress Notes (Signed)
[facility] PROGRESS NOTE  Shary DecampBrian Plott ZOX:[id number] DOB: [birth year] DOA: 10/04/2014 PCP: No PCP Per Patient  Assessment/Plan: 1-dental abscess and facial cellulitis: dental surgeon consulted -S/P surgery/extractions today 5/23 -continue IV antibiotics and PRN pain meds -continue supportive care -continue IVF's to maintain hydration   2-SIRS/early sepsis due to dental abscess/face cellulitis -features of severe infection essentially resolved -will continue IV antibiotics X 1 more night and supportive care -WBC's WNL now -has remained afebrile -dental extractions performed today; no complications and well tolerated per patient. -most likely home in am if remains stable  3-tobacco abuse: cessation counseling provided -patient on nicoderm  4-insomnia: will continue using PRN ambien  5-constipation: miralax and colace started.  DVT: SCD's and ambulation   Code Status: Full Family Communication: no family at bedside Disposition Plan: to OR on Monday for dental surgery, extractions and alveoplasty. Continue IV antibiotics and full liquid diet, advancing as tolerated.   Consultants:  Dental surgeon (Dr. Kristin BruinsKulinski)  ENT  Procedures:  See below for x-ray reports   Antibiotics:  Clindamycin 5/19  HPI/Subjective: Currently afebrile. No CP or SOB. Swelling, erythema and pain much improved. Patient s/p multiple dental extractions, tolerated procedure well. Now with pain and difficulty opening his mouth.   Objective: Filed Vitals:   10/08/14 1955  BP: 118/73  Pulse: 81  Temp: 98 F (36.7 C)  Resp: 16    Intake/Output Summary (Last 24 hours) at 10/09/14 0009 Last data filed at 10/08/14 1807  Gross per 24 hour  Intake   1470 ml  Output   1375 ml  Net     95 ml   Filed Weights   10/06/14 2106 10/07/14 2151 10/08/14 1955  Weight: 61.4 kg (135 lb 5.8 oz) 62.2 kg (137 lb 2 oz) 64.1 kg (141 lb 5 oz)    Exam:   General:  Afebrile, feeling better and  with swelling/erythema much improved. S/p dental surgery  Cardiovascular: S1 and S2, no rubs or gallops  Respiratory: no wheezing, no crackles  Abdomen: soft, NT, ND, positive BS  Musculoskeletal: no joint swelling; FROM  Data Reviewed: Basic Metabolic Panel:  Recent Labs Lab 10/04/14 0200 10/04/14 0845 10/06/14 0429 10/08/14 0555  NA 135 140 141 140  K 3.5 4.6 4.6 4.2  CL 100* 104 105 105  CO2 26 25 31 27   GLUCOSE 129* 99 114* 95  BUN 15 9 6 6   CREATININE 0.94 0.90 0.82 0.90  CALCIUM 9.1 9.5 9.0 8.9   CBC:  Recent Labs Lab 10/04/14 0200 10/04/14 0845 10/06/14 0429 10/08/14 0555  WBC 16.4* 13.8* 10.2 7.9  NEUTROABS 12.4*  --   --   --   HGB 16.0 15.7 13.7 13.7  HCT 47.5 47.2 41.4 41.4  MCV 90.3 89.4 90.2 89.2  PLT 285 254 224 240    Recent Results (from the past 240 hour(s))  Culture, blood (routine x 2)     Status: None (Preliminary result)   Collection Time: 10/04/14  2:00 AM  Result Value Ref Range Status   Specimen Description BLOOD LEFT ANTECUBITAL  Final   Special Requests BOTTLES DRAWN AEROBIC AND ANAEROBIC 6CC EACH  Final   Culture NO GROWTH 4 DAYS  Final   Report Status PENDING  Incomplete  Culture, blood (routine x 2)     Status: None (Preliminary result)   Collection Time: 10/04/14  2:12 AM  Result Value Ref Range Status   Specimen Description BLOOD RIGHT ANTECUBITAL  Final   Special Requests   Final  BOTTLES DRAWN AEROBIC AND ANAEROBIC AEB=10CC ANA=6CC   Culture NO GROWTH 4 DAYS  Final   Report Status PENDING  Incomplete  Surgical pcr screen     Status: None   Collection Time: 10/07/14 10:00 PM  Result Value Ref Range Status   MRSA, PCR NEGATIVE NEGATIVE Final   Staphylococcus aureus NEGATIVE NEGATIVE Final    Comment:        The Xpert SA Assay (FDA approved for NASAL specimens in patients over 33 years of age), is one component of a comprehensive surveillance program.  Test performance has been validated by [facility] for  patients greater than or equal to 101 year old. It is not intended to diagnose infection nor to guide or monitor treatment.      Studies: No results found.  Scheduled Meds: . clindamycin (CLEOCIN) IV  600 mg Intravenous 3 times per day  . docusate sodium  100 mg Oral BID  . nicotine  7 mg Transdermal Daily  . polyethylene glycol  17 g Oral Daily   Continuous Infusions: . sodium chloride 50 mL/hr at 10/08/14 1038  . lactated ringers      Principal Problem:   Cellulitis and abscess of face Active Problems:   Facial abscess   Leukocytosis   Tobacco use disorder   DVT prophylaxis    Time spent: 30 minutes    Vassie Loll  [facility] Pager [phone]. If 7PM-7AM, please contact night-coverage at [email or web address], password [facility] 10/09/2014, 12:09 AM  LOS: 5 days

## 2014-10-09 NOTE — Care Management Note (Signed)
Case Management Note  Patient Details  Name: Donald Davies MRN: [id number] Date of Birth: May 26, 1980  Subjective/Objective:                 CM following for progression and d/c planning.   Action/Plan: 10/09/14 No HH or DME needs, MATCH letter given to assist with antibiotics.   Expected Discharge Date:       10/09/2014           Expected Discharge Plan:  Home/Self Care  In-House Referral:     Discharge planning Services  CM Consult  Post Acute Care Choice:  NA Choice offered to:  NA  DME Arranged:    DME Agency:     HH Arranged:    HH Agency:     Status of Service:  Completed, signed off  Medicare Important Message Given:  No Date Medicare IM Given:    Medicare IM give by:    Date Additional Medicare IM Given:    Additional Medicare Important Message give by:     If discussed at Long Length of Stay Meetings, dates discussed:    Additional Comments:  Starlyn SkeansRoyal, Loyd Salvador U, RN 10/09/2014, 11:30 AM

## 2014-10-09 NOTE — Discharge Summary (Signed)
Physician Discharge Summary  Donald Davies ZOX:[id number] DOB: June 10, 1980 DOA: 10/04/2014  PCP: No PCP Per Patient  Admit date: 10/04/2014 Discharge date: 10/09/2014  Time spent: >30 minutes  Recommendations for Outpatient Follow-up:  1. Reassess CBC to follow Hgb trend  Discharge Diagnoses:  Principal Problem:   Cellulitis and abscess of face Active Problems:   Facial abscess   Leukocytosis   Tobacco use disorder    Discharge Condition: stable and improved. Discharge home on PO antibiotics. Will follow with dental surgeon in 7-10 days  Diet recommendation: soft diet  Filed Weights   10/06/14 2106 10/07/14 2151 10/08/14 1955  Weight: 61.4 kg (135 lb 5.8 oz) 62.2 kg (137 lb 2 oz) 64.1 kg (141 lb 5 oz)    History of present illness:  34 y.o. male who presents to the ED with complaints of left sided facial swelling and pain since the AM along with fevers and chills. He reports having a toothache for several months of the left maxillary second premolar tooth and in 04/2014 had Augmentin Rx and Hydrocodone prescribed. He has not seen a Dentist, and has placed an OTC sealant on the tooth. He was evaluated in the ED and ws found to have a temperature to 101.2. A Maxillofacial CT scan was performed and revealed an abscess of the left maxillary second premolar extending into the maxillary sinus with erosion and associated facial edema. Blood Cultures were sent and he was placed on IV Clindamycin. The EDP DR Joanne Gavel with ENT on Call Dr Suszanne Conners who recommended transfer to [facility] and a Dental Consult this AM.    Hospital Course:  1-dental abscess and facial cellulitis:  -S/P surgery/multiple extractions and cleaning of dental abcess on 5/23 -continue PRN pain meds -PRN feeding supplement for better nutrition if needed -advise to follow soft diet and to maintain himself well hydrated   2-SIRS/early sepsis due to dental abscess/face cellulitis -features of severe  infection essentially resolved -will discharge on vancomycin to complete abx therapy -WBC's WNL now -has remained afebrile -will follow with dental surgeon in 7-10 days after discharge  3-tobacco abuse: cessation counseling provided -provide nicoderm prescription at discharge  4-insomnia: will continue using PRN ambien  5-constipation: miralax prescribed at discharge. -advise to hold for diarrhea and to maintain good hydration   Procedures:  See below for x-ray reports  Extensive dental extractions, cleaning of dental abscess and alveoloplasty 5/23  Consultations:  ENT  Dental surgery   Discharge Exam: Filed Vitals:   10/09/14 0536  BP: 107/71  Pulse: 77  Temp: 98.1 F (36.7 C)  Resp: 16    General: Afebrile, feeling  A lot better and with swelling/erythema much improved/resolved. S/p dental surgery and with good tolerance in pain and PO intake  Cardiovascular: S1 and S2, no rubs or gallops  Respiratory: no wheezing, no crackles  Abdomen: soft, NT, ND, positive BS  Musculoskeletal: no joint swelling; FROM   Discharge Instructions   Discharge Instructions    Discharge instructions    Complete by:  As directed   Stop smoking Follow with Dr. Kristin Bruins as instructed Take medications as prescribed  Follow soft diet Please maintain good hydration          Current Discharge Medication List    START taking these medications   Details  clindamycin (CLEOCIN) 300 MG capsule Take 1 capsule (300 mg total) by mouth 3 (three) times daily. Qty: 21 capsule, Refills: 0    nicotine (NICODERM CQ - DOSED IN MG/24 HR)  7 mg/24hr patch Place 1 patch (7 mg total) onto the skin daily. Qty: 28 patch, Refills: 0    polyethylene glycol (MIRALAX / GLYCOLAX) packet Take 17 g by mouth daily. Hold for diarrhea Qty: 14 each, Refills: 0      CONTINUE these medications which have CHANGED   Details  HYDROcodone-acetaminophen (NORCO/VICODIN) 5-325 MG per tablet Take 1-2  tablets by mouth every 6 (six) hours as needed for severe pain. Qty: 20 tablet, Refills: 0      STOP taking these medications     ibuprofen (ADVIL,MOTRIN) 200 MG tablet      amoxicillin (AMOXIL) 500 MG capsule        Allergies  Allergen Reactions  . Ritalin [Methylphenidate Hcl]   . Robitussin (Alcohol Free) [Guaifenesin]    Follow-up Information    Follow up with Charlynne Pander, DDS. Schedule an appointment as soon as possible for a visit on 10/17/2014.   Specialty:  Dentistry   Why:  For suture removal, For wound re-check; contact office to received time/appointment details   Contact information:   [address] [place] [address] [phone]       Follow up with [facility]    . Schedule an appointment as soon as possible for a visit in 2 weeks.   Why:  please schedule appointment in 2 weeks or so to establish care and establisch primary care relationship    Contact information:   [address] [facility] [address] [phone]       The results of significant diagnostics from this hospitalization (including imaging, microbiology, ancillary and laboratory) are listed below for reference.    Significant Diagnostic Studies: Dg Orthopantogram  10/04/2014   CLINICAL DATA:  Dental abscess.  EXAMTresa Endo  COMPARISON:  10/04/2014 CT.  FINDINGS: Carious dentition is present. The largest area of cavity and apical osteolysis is at the seconds LEFT maxillary premolar. This corresponds abnormality identified on prior CT. Multiple dental cavities are present, some adjacent to fillings.  IMPRESSION: Orthopantomogram corroborates CT findings of carious dentition with periapical abscess at the LEFT second maxillary premolar.   Electronically Signed   By: Andreas Newport M.D.   On: 10/04/2014 08:59   Ct Maxillofacial W/cm  10/04/2014   CLINICAL DATA:  Acute onset of left cheek swelling and erythema. Initial  encounter.  EXAM: CT MAXILLOFACIAL WITH CONTRAST  TECHNIQUE: Multidetector CT imaging of the maxillofacial structures was performed with intravenous contrast. Multiplanar CT image reconstructions were also generated. A small metallic BB was placed on the right temple in order to reliably differentiate right from left.  CONTRAST:  80mL OMNIPAQUE IOHEXOL 300 MG/ML  SOLN  COMPARISON:  None.  FINDINGS: There is diffuse soft tissue inflammation and edema along the left side of the face, overlying the left maxilla and upper mandible. This appears to reflect a 1.3 x 0.7 cm soft tissue abscess overlying the left maxilla, due to diffuse erosion of the left second maxillary premolar. An underlying periapical abscess is seen, which appears to extend into the left maxillary sinus, with mucosal thickening at the left maxillary sinus.  Additional partial absence of multiple teeth is noted, at the left second maxillary molar, left first maxillary premolar, right first maxillary molar, left second mandibular molar, left second mandibular premolar, right first mandibular molar, and right second mandibular molar.  There is no evidence of fracture or dislocation. The maxilla and mandible appear intact. The nasal bone is unremarkable in appearance.  The orbits are intact bilaterally. The remaining visualized paranasal sinuses and mastoid air cells are well-aerated.  No significant soft tissue abnormalities are seen. The parapharyngeal fat planes are preserved. The nasopharynx, oropharynx and hypopharynx are unremarkable in appearance. The visualized portions of the valleculae and piriform sinuses are grossly unremarkable.  The parotid and submandibular glands are within normal limits. No cervical lymphadenopathy is seen. Visualized cervical nodes remain borderline normal in size.  IMPRESSION: 1. Periapical abscess at the root of the left second maxillary premolar, with diffuse erosion of the left second maxillary premolar. Overlying  1.3 x 0.7 cm soft tissue abscess noted, with associated diffuse soft tissue inflammation and edema along the left side of the face. The periapical abscess appears to extend into the left maxillary sinus, with mucosal thickening at the left maxillary sinus. 2. Additional partial absence of multiple teeth, as described above.   Electronically Signed   By: Roanna RaiderJeffery  Chang M.D.   On: 10/04/2014 03:13    Microbiology: Recent Results (from the past 240 hour(s))  Culture, blood (routine x 2)     Status: None (Preliminary result)   Collection Time: 10/04/14  2:00 AM  Result Value Ref Range Status   Specimen Description BLOOD LEFT ANTECUBITAL  Final   Special Requests BOTTLES DRAWN AEROBIC AND ANAEROBIC 6CC EACH  Final   Culture NO GROWTH 4 DAYS  Final   Report Status PENDING  Incomplete  Culture, blood (routine x 2)     Status: None (Preliminary result)   Collection Time: 10/04/14  2:12 AM  Result Value Ref Range Status   Specimen Description BLOOD RIGHT ANTECUBITAL  Final   Special Requests   Final    BOTTLES DRAWN AEROBIC AND ANAEROBIC AEB=10CC ANA=6CC   Culture NO GROWTH 4 DAYS  Final   Report Status PENDING  Incomplete  Surgical pcr screen     Status: None   Collection Time: 10/07/14 10:00 PM  Result Value Ref Range Status   MRSA, PCR NEGATIVE NEGATIVE Final   Staphylococcus aureus NEGATIVE NEGATIVE Final    Comment:        The Xpert SA Assay (FDA approved for NASAL specimens in patients over 33 years of age), is one component of a comprehensive surveillance program.  Test performance has been validated by [facility] for patients greater than or equal to 33 year old. It is not intended to diagnose infection nor to guide or monitor treatment.      Labs: Basic Metabolic Panel:  Recent Labs Lab 10/04/14 0200 10/04/14 0845 10/06/14 0429 10/08/14 0555  NA 135 140 141 140  K 3.5 4.6 4.6 4.2  CL 100* 104 105 105  CO2 26 25 31 27   GLUCOSE 129* 99 114* 95  BUN 15 9 6 6    CREATININE 0.94 0.90 0.82 0.90  CALCIUM 9.1 9.5 9.0 8.9   CBC:  Recent Labs Lab 10/04/14 0200 10/04/14 0845 10/06/14 0429 10/08/14 0555  WBC 16.4* 13.8* 10.2 7.9  NEUTROABS 12.4*  --   --   --   HGB 16.0 15.7 13.7 13.7  HCT 47.5 47.2 41.4 41.4  MCV 90.3 89.4 90.2 89.2  PLT 285 254 224 240    Signed:  Vassie LollMadera, Gabriell Casimir  [facility] 10/09/2014, 9:08 AM

## 2014-10-16 ENCOUNTER — Ambulatory Visit (HOSPITAL_COMMUNITY): Payer: Self-pay | Admitting: Dentistry

## 2014-10-16 ENCOUNTER — Telehealth (HOSPITAL_COMMUNITY): Payer: Self-pay

## 2014-10-16 NOTE — Telephone Encounter (Signed)
10/16/14         Patient called Donald Davies/Broken appt. for S/R on 10/16/14.  Patient called day of appt. unable to keep appt. Rescheduled to 10/22/14 @ 11:30.  LRI

## 2014-10-22 ENCOUNTER — Ambulatory Visit (HOSPITAL_COMMUNITY): Payer: Self-pay | Admitting: Dentistry

## 2014-10-22 ENCOUNTER — Telehealth (HOSPITAL_COMMUNITY): Payer: Self-pay

## 2014-10-22 NOTE — Telephone Encounter (Signed)
10/22/14            Broken appt. for F/U appt. w/Dr. Kristin BruinsKulinski on 10/22/14.  Left msg. for patient to call Dental Medicine.  LRI

## 2016-09-18 ENCOUNTER — Emergency Department (HOSPITAL_COMMUNITY)
Admission: EM | Admit: 2016-09-18 | Discharge: 2016-09-18 | Disposition: A | Discharge status: Home / Self Care | Payer: Medicaid Other | Attending: Emergency Medicine | Admitting: Emergency Medicine

## 2016-09-18 ENCOUNTER — Encounter (HOSPITAL_COMMUNITY): Payer: Self-pay | Admitting: *Deleted

## 2016-09-18 DIAGNOSIS — K029 Dental caries, unspecified: Secondary | ICD-10-CM | POA: Insufficient documentation

## 2016-09-18 DIAGNOSIS — F17210 Nicotine dependence, cigarettes, uncomplicated: Secondary | ICD-10-CM | POA: Diagnosis not present

## 2016-09-18 DIAGNOSIS — K0889 Other specified disorders of teeth and supporting structures: Secondary | ICD-10-CM | POA: Diagnosis present

## 2016-09-18 DIAGNOSIS — Z79899 Other long term (current) drug therapy: Secondary | ICD-10-CM | POA: Diagnosis not present

## 2016-09-18 MED ORDER — ONDANSETRON HCL 4 MG PO TABS
4.0000 mg | ORAL_TABLET | Freq: Once | ORAL | Status: AC
  Administered 2016-09-18: 4 mg via ORAL
  Filled 2016-09-18: qty 1

## 2016-09-18 MED ORDER — IBUPROFEN 800 MG PO TABS
800.0000 mg | ORAL_TABLET | Freq: Once | ORAL | Status: AC
  Administered 2016-09-18: 800 mg via ORAL
  Filled 2016-09-18: qty 1

## 2016-09-18 MED ORDER — ACETAMINOPHEN 325 MG PO TABS
650.0000 mg | ORAL_TABLET | Freq: Once | ORAL | Status: AC
  Administered 2016-09-18: 650 mg via ORAL
  Filled 2016-09-18: qty 2

## 2016-09-18 MED ORDER — CLINDAMYCIN HCL 150 MG PO CAPS: 150.0000 mg | ORAL_CAPSULE | Freq: Four times a day (QID) | ORAL | 0 refills | Status: DC

## 2016-09-18 MED ORDER — IBUPROFEN 600 MG PO TABS: 600.0000 mg | ORAL_TABLET | Freq: Four times a day (QID) | ORAL | 0 refills | Status: DC | PRN

## 2016-09-18 MED ORDER — TRAMADOL HCL 50 MG PO TABS: 50.0000 mg | ORAL_TABLET | Freq: Four times a day (QID) | ORAL | 0 refills | Status: DC | PRN

## 2016-09-18 MED ORDER — CLINDAMYCIN HCL 150 MG PO CAPS
300.0000 mg | ORAL_CAPSULE | Freq: Once | ORAL | Status: AC
  Administered 2016-09-18: 300 mg via ORAL
  Filled 2016-09-18: qty 2

## 2016-09-18 NOTE — ED Provider Notes (Signed)
[facility]-EMERGENCY DEPT Provider Note   CSN: [id number] Arrival date & time: 09/18/16  1007     History   Chief Complaint Chief Complaint  Patient presents with  . Dental Pain    HPI Donald Davies is a 36 y.o. male.  The history is provided by the patient.  Dental Pain   This is a new problem. The current episode started yesterday. The problem occurs constantly. The problem has been gradually worsening. The pain is moderate. He has tried heat (ibuprofen) for the symptoms. The treatment provided no relief.    Past Medical History:  Diagnosis Date  . Seizures [facility](HCC)     Patient Active Problem List   Diagnosis Date Noted  . Dental abscess   . Insomnia   . CN (constipation)   . Facial abscess 10/04/2014  . Cellulitis and abscess of face 10/04/2014  . Leukocytosis 10/04/2014  . Tobacco use disorder 10/04/2014  . DVT prophylaxis 10/04/2014  . Facial infection     Past Surgical History:  Procedure Laterality Date  . MULTIPLE EXTRACTIONS WITH ALVEOLOPLASTY N/A 10/08/2014   Procedure: Extraction of tooth #'s [phone] [id number],3,12,14,15,16 [id number],18,30,31,32 with alveoloplasty;  Surgeon: Charlynne Panderonald F Kulinski, DDS;  [facility];  Service: Oral Surgery;  Laterality: N/A;       Home Medications    Prior to Admission medications   Medication Sig Start Date End Date Taking? Authorizing Provider  HYDROcodone-acetaminophen (NORCO/VICODIN) 5-325 MG per tablet Take 1-2 tablets by mouth every 6 (six) hours as needed for severe pain. 10/09/14   Vassie Lollarlos Madera, MD  nicotine (NICODERM CQ - DOSED IN MG/24 HR) 7 mg/24hr patch Place 1 patch (7 mg total) onto the skin daily. 10/09/14   Vassie Lollarlos Madera, MD  polyethylene glycol [facility](MIRALAX / GLYCOLAX) packet Take 17 g by mouth daily. Hold for diarrhea 10/09/14   Vassie Lollarlos Madera, MD    Family History No family history on file.  Social History Social History  Substance Use Topics  . Smoking status: Current Every Day Smoker    Packs/day: 1.00    Types: Cigarettes    . Smokeless tobacco: Never Used  . Alcohol use No     Allergies   Ritalin [methylphenidate hcl] and Robitussin (alcohol free) [guaifenesin]   Review of Systems Review of Systems  Constitutional: Negative for activity change.       All ROS Neg except as noted in HPI  HENT: Positive for dental problem and facial swelling. Negative for nosebleeds, trouble swallowing and voice change.   Eyes: Negative for photophobia and discharge.  Respiratory: Negative for cough, shortness of breath and wheezing.   Cardiovascular: Negative for chest pain and palpitations.  Gastrointestinal: Negative for abdominal pain and blood in stool.  Genitourinary: Negative for dysuria, frequency and hematuria.  Musculoskeletal: Negative for arthralgias, back pain and neck pain.  Skin: Negative.   Neurological: Negative for dizziness, seizures and speech difficulty.  Psychiatric/Behavioral: Negative for confusion and hallucinations.     Physical Exam Updated Vital Signs BP 131/76 (BP Location: Right Arm)   Pulse 75   Temp 99.1 F (37.3 C) (Oral)   Resp 18   Ht 6' (1.829 m)   Wt 63.5 kg   SpO2 98%   BMI 18.99 kg/m   Physical Exam  Constitutional: He is oriented to person, place, and time. He appears well-developed and well-nourished.  Non-toxic appearance.  HENT:  Head: Normocephalic.    Right Ear: Tympanic membrane and external ear normal.  Left Ear: Tympanic membrane and external ear normal.  Mouth/Throat: Dental caries present.  There is swelling of the right upper gum. There is severe tenderness at the right upper canine and premolar area. The airway is patent. There is no swelling under the tongue.  There is mild facial swelling of the right face near the right maxilla  Eyes: EOM and lids are normal. Pupils are equal, round, and reactive to light.  Neck: Normal range of motion. Neck supple. Carotid bruit is not present.  Cardiovascular: Normal rate, regular rhythm, normal heart sounds,  intact distal pulses and normal pulses.   Pulmonary/Chest: Breath sounds normal. No respiratory distress.  Abdominal: Soft. Bowel sounds are normal. There is no tenderness. There is no guarding.  Musculoskeletal: Normal range of motion.  Lymphadenopathy:       Head (right side): No submandibular adenopathy present.       Head (left side): No submandibular adenopathy present.    He has no cervical adenopathy.  Neurological: He is alert and oriented to person, place, and time. He has normal strength. No cranial nerve deficit or sensory deficit.  Skin: Skin is warm and dry.  Psychiatric: He has a normal mood and affect. His speech is normal.  Nursing note and vitals reviewed.    ED Treatments / Results  Labs (all labs ordered are listed, but only abnormal results are displayed) Labs Reviewed - No data to display  EKG  EKG Interpretation None       Radiology No results found.  Procedures Procedures (including critical care time)  Medications Ordered in ED Medications  clindamycin (CLEOCIN) capsule 300 mg (300 mg Oral Given 09/18/16 1139)  ibuprofen (ADVIL,MOTRIN) tablet 800 mg (800 mg Oral Given 09/18/16 1139)  acetaminophen (TYLENOL) tablet 650 mg (650 mg Oral Given 09/18/16 1139)  ondansetron (ZOFRAN) tablet 4 mg (4 mg Oral Given 09/18/16 1139)     Initial Impression / Assessment and Plan / ED Course  I have reviewed the triage vital signs and the nursing notes.  Pertinent labs & imaging results that were available during my care of the patient were reviewed by me and considered in my medical decision making (see chart for details).     **I have reviewed nursing notes, vital signs, and all appropriate lab and imaging results for this patient.*  Final Clinical Impressions(s) / ED Diagnoses  MDM Vital signs reviewed. The examination favors dental caries with possible abscess. Patient has some facial swelling. No visible abscess of the gum. The airway is patent. There is no  evidence for Ludwig's angina or other emergent changes or problems.  The patient will be treated with clindamycin, and ibuprofen. Patient strongly encouraged to see a dentist as soon as possible.    Final diagnoses:  Pain, dental    New Prescriptions New Prescriptions   No medications on file     Ivery Quale, Cordelia Poche 09/19/16 1036    Derwood Kaplan, MD 09/20/16 [phone]

## 2016-09-18 NOTE — Discharge Instructions (Signed)
Please use clindamycin and ibuprofen with breakfast, lunch, dinner, and at bedtime. It is important that you see a dentist as soon as possible. May use Ultram for more severe pain. This medication may cause drowsiness. Please do not drink, drive, or participate in activity that requires concentration while taking this medication.

## 2016-09-18 NOTE — ED Triage Notes (Signed)
Pt had right upper dental pain and swelling that started yesterday. No breathing problems noted.

## 2018-03-12 ENCOUNTER — Encounter (HOSPITAL_COMMUNITY): Payer: Self-pay | Admitting: Emergency Medicine

## 2018-03-12 ENCOUNTER — Emergency Department (HOSPITAL_COMMUNITY)
Admission: EM | Admit: 2018-03-12 | Discharge: 2018-03-12 | Disposition: A | Discharge status: Home / Self Care | Payer: Medicaid Other | Attending: Emergency Medicine | Admitting: Emergency Medicine

## 2018-03-12 ENCOUNTER — Emergency Department (HOSPITAL_COMMUNITY): Payer: Medicaid Other

## 2018-03-12 DIAGNOSIS — W2113XA Struck by golf club, initial encounter: Secondary | ICD-10-CM | POA: Insufficient documentation

## 2018-03-12 DIAGNOSIS — F17210 Nicotine dependence, cigarettes, uncomplicated: Secondary | ICD-10-CM | POA: Diagnosis not present

## 2018-03-12 DIAGNOSIS — S82891A Other fracture of right lower leg, initial encounter for closed fracture: Secondary | ICD-10-CM

## 2018-03-12 DIAGNOSIS — Z23 Encounter for immunization: Secondary | ICD-10-CM | POA: Insufficient documentation

## 2018-03-12 DIAGNOSIS — Y9389 Activity, other specified: Secondary | ICD-10-CM | POA: Diagnosis not present

## 2018-03-12 DIAGNOSIS — Z79899 Other long term (current) drug therapy: Secondary | ICD-10-CM | POA: Insufficient documentation

## 2018-03-12 DIAGNOSIS — Y92830 Public park as the place of occurrence of the external cause: Secondary | ICD-10-CM | POA: Insufficient documentation

## 2018-03-12 DIAGNOSIS — S81811A Laceration without foreign body, right lower leg, initial encounter: Secondary | ICD-10-CM | POA: Diagnosis not present

## 2018-03-12 DIAGNOSIS — Y999 Unspecified external cause status: Secondary | ICD-10-CM | POA: Insufficient documentation

## 2018-03-12 DIAGNOSIS — S8251XA Displaced fracture of medial malleolus of right tibia, initial encounter for closed fracture: Secondary | ICD-10-CM | POA: Insufficient documentation

## 2018-03-12 DIAGNOSIS — S81812A Laceration without foreign body, left lower leg, initial encounter: Secondary | ICD-10-CM | POA: Diagnosis not present

## 2018-03-12 MED ORDER — LIDOCAINE HCL (PF) 1 % IJ SOLN
10.0000 mL | Freq: Once | INTRAMUSCULAR | Status: AC
  Administered 2018-03-12: 10 mL
  Filled 2018-03-12: qty 10

## 2018-03-12 MED ORDER — SULFAMETHOXAZOLE-TRIMETHOPRIM 800-160 MG PO TABS: 1.0000 | ORAL_TABLET | Freq: Two times a day (BID) | ORAL | 0 refills | Status: AC

## 2018-03-12 MED ORDER — TETANUS-DIPHTH-ACELL PERTUSSIS 5-2.5-18.5 LF-MCG/0.5 IM SUSP
0.5000 mL | Freq: Once | INTRAMUSCULAR | Status: AC
  Administered 2018-03-12: 0.5 mL via INTRAMUSCULAR
  Filled 2018-03-12: qty 0.5

## 2018-03-12 MED ORDER — IBUPROFEN 800 MG PO TABS: 800.0000 mg | ORAL_TABLET | Freq: Three times a day (TID) | ORAL | 0 refills | Status: DC

## 2018-03-12 MED ORDER — IBUPROFEN 800 MG PO TABS
800.0000 mg | ORAL_TABLET | Freq: Once | ORAL | Status: AC
  Administered 2018-03-12: 800 mg via ORAL
  Filled 2018-03-12: qty 1

## 2018-03-12 NOTE — ED Notes (Signed)
KS in to assess 
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
## 2018-03-12 NOTE — Discharge Instructions (Signed)
Staple removal in 8 days  

## 2018-03-14 ENCOUNTER — Telehealth: Payer: Self-pay | Admitting: Orthopedic Surgery

## 2018-03-14 NOTE — Telephone Encounter (Signed)
Patient called following Emergency room visit at [facility] 03/12/18 for left lower leg injury. Reviewed with Dr Romeo Apple. Called back to patient to offer appointment with either doctor for first available - reached voice mail - left message to return call.

## 2018-03-15 NOTE — Telephone Encounter (Signed)
Received call back from patient and spouse - said did not receive our message from last evening. Scheduled appointment; aware.

## 2018-03-16 ENCOUNTER — Encounter: Payer: Self-pay | Admitting: Orthopaedic Surgery

## 2018-03-16 ENCOUNTER — Ambulatory Visit: Payer: Medicaid Other | Admitting: Orthopaedic Surgery

## 2018-03-16 VITALS — BP 118/73 | HR 68 | Ht 72.0 in | Wt 150.0 lb

## 2018-03-16 DIAGNOSIS — S81811A Laceration without foreign body, right lower leg, initial encounter: Secondary | ICD-10-CM

## 2018-03-16 DIAGNOSIS — S8254XA Nondisplaced fracture of medial malleolus of right tibia, initial encounter for closed fracture: Secondary | ICD-10-CM

## 2018-03-16 MED ORDER — HYDROCODONE-ACETAMINOPHEN 5-325 MG PO TABS: ORAL_TABLET | ORAL | 0 refills | Status: DC

## 2018-03-16 NOTE — Patient Instructions (Signed)
Out of work next six weeks.
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
## 2018-03-23 ENCOUNTER — Encounter: Payer: Self-pay | Admitting: Orthopaedic Surgery

## 2018-03-23 ENCOUNTER — Ambulatory Visit (INDEPENDENT_AMBULATORY_CARE_PROVIDER_SITE_OTHER): Payer: Medicaid Other

## 2018-03-23 ENCOUNTER — Ambulatory Visit (INDEPENDENT_AMBULATORY_CARE_PROVIDER_SITE_OTHER): Payer: Medicaid Other | Admitting: Orthopaedic Surgery

## 2018-03-23 VITALS — BP 133/73 | HR 83 | Ht 72.0 in

## 2018-03-23 DIAGNOSIS — S8254XD Nondisplaced fracture of medial malleolus of right tibia, subsequent encounter for closed fracture with routine healing: Secondary | ICD-10-CM

## 2018-03-23 DIAGNOSIS — S81801D Unspecified open wound, right lower leg, subsequent encounter: Secondary | ICD-10-CM

## 2018-03-23 MED ORDER — HYDROCODONE-ACETAMINOPHEN 5-325 MG PO TABS: ORAL_TABLET | ORAL | 0 refills | Status: DC

## 2018-03-23 NOTE — Progress Notes (Signed)
CC:  My leg is still hurting some  He had the cast removed and the wound of the right lower leg proximally and anteriorly looks good.  Staples removed.  New dressing applied.  Ankle on the right has less swelling medially.  NV intact.  X-rays were done of the right ankle, reported separately.  Encounter Diagnoses  Name Primary?  . Closed nondisplaced fracture of medial malleolus of right tibia with routine healing, subsequent encounter Yes  . Leg wound, right, subsequent encounter    A new short leg cast applied.  Return in one month.  X-rays out of cast on return.  Call if any problem.  Precautions discussed.  I have reviewed the [place] Controlled Substance Reporting System web site prior to prescribing narcotic medicine for this patient.    Electronically Signed Darreld Mclean, MD 11/6/20199:24 AM

## 2018-04-04 ENCOUNTER — Telehealth: Payer: Self-pay | Admitting: Orthopaedic Surgery

## 2018-04-04 ENCOUNTER — Ambulatory Visit (INDEPENDENT_AMBULATORY_CARE_PROVIDER_SITE_OTHER): Payer: Medicaid Other | Admitting: Orthopedic Surgery

## 2018-04-04 VITALS — Ht 72.0 in | Wt 150.0 lb

## 2018-04-04 DIAGNOSIS — S8254XD Nondisplaced fracture of medial malleolus of right tibia, subsequent encounter for closed fracture with routine healing: Secondary | ICD-10-CM

## 2018-04-04 DIAGNOSIS — S8291XA Unspecified fracture of right lower leg, initial encounter for closed fracture: Secondary | ICD-10-CM | POA: Diagnosis not present

## 2018-04-04 DIAGNOSIS — S81801D Unspecified open wound, right lower leg, subsequent encounter: Secondary | ICD-10-CM

## 2018-04-04 NOTE — Telephone Encounter (Signed)
Patient and wife on phone, designated contact, Morrie Sheldonshley, relays that patient got his cast wet "over the weekend" and that it has broken underneath foot. I relayed we will need to see him - see Dr Romeo AppleHarrison today (it is now 2:50pm) or see Dr Hilda LiasKeeling in the morning when he returns to clinic?

## 2018-04-04 NOTE — Progress Notes (Signed)
Chief Complaint  Patient presents with  . Follow-up    Recheck on right ankle, cast got wet. DOI 03-12-18.    Encounter Diagnoses  Name Primary?  . Closed nondisplaced fracture of medial malleolus of right tibia with routine healing, subsequent encounter Yes  . Leg wound, right, subsequent encounter     DRESSING CHANGED   Cast was wet, cast was changed  FU WITH DR Hilda LiasKEELING AS NOTED

## 2018-04-20 ENCOUNTER — Ambulatory Visit (INDEPENDENT_AMBULATORY_CARE_PROVIDER_SITE_OTHER): Payer: Medicaid Other | Admitting: Orthopaedic Surgery

## 2018-04-20 ENCOUNTER — Encounter: Payer: Self-pay | Admitting: Orthopaedic Surgery

## 2018-04-20 ENCOUNTER — Ambulatory Visit (INDEPENDENT_AMBULATORY_CARE_PROVIDER_SITE_OTHER): Payer: Medicaid Other

## 2018-04-20 DIAGNOSIS — S8254XD Nondisplaced fracture of medial malleolus of right tibia, subsequent encounter for closed fracture with routine healing: Secondary | ICD-10-CM | POA: Diagnosis not present

## 2018-04-20 NOTE — Progress Notes (Signed)
CC: my ankle does not hurt  He was removed from the cast on the right lower leg.  Skin is OK.  NV intact.  ROM is good first time out of the cast.  X-rays were done of the right ankle, reported separately.  Encounter Diagnosis  Name Primary?  . Closed nondisplaced fracture of medial malleolus of right tibia with routine healing, subsequent encounter Yes   Begin gentle ROM and weight bearing on the right ankle. Gradually come off crutches.  Return in two weeks.  X-rays then of the right ankle.  Call if any problem.  Precautions discussed.   Electronically Signed Darreld McleanWayne Robertt Buda, MD 12/4/20198:50 AM

## 2018-05-04 ENCOUNTER — Ambulatory Visit: Payer: Medicaid Other | Admitting: Orthopaedic Surgery

## 2018-05-05 ENCOUNTER — Encounter: Payer: Self-pay | Admitting: Orthopaedic Surgery

## 2019-02-13 ENCOUNTER — Encounter (HOSPITAL_COMMUNITY): Payer: Self-pay

## 2019-02-13 ENCOUNTER — Emergency Department (HOSPITAL_COMMUNITY)
Admission: EM | Admit: 2019-02-13 | Discharge: 2019-02-13 | Disposition: A | Discharge status: Home / Self Care | Payer: Medicaid Other | Attending: Emergency Medicine | Admitting: Emergency Medicine

## 2019-02-13 ENCOUNTER — Other Ambulatory Visit: Payer: Self-pay

## 2019-02-13 DIAGNOSIS — Y939 Activity, unspecified: Secondary | ICD-10-CM | POA: Insufficient documentation

## 2019-02-13 DIAGNOSIS — Z79899 Other long term (current) drug therapy: Secondary | ICD-10-CM | POA: Diagnosis not present

## 2019-02-13 DIAGNOSIS — S51812A Laceration without foreign body of left forearm, initial encounter: Secondary | ICD-10-CM | POA: Diagnosis present

## 2019-02-13 DIAGNOSIS — Y999 Unspecified external cause status: Secondary | ICD-10-CM | POA: Insufficient documentation

## 2019-02-13 DIAGNOSIS — Y929 Unspecified place or not applicable: Secondary | ICD-10-CM | POA: Insufficient documentation

## 2019-02-13 DIAGNOSIS — F17210 Nicotine dependence, cigarettes, uncomplicated: Secondary | ICD-10-CM | POA: Insufficient documentation

## 2019-02-13 DIAGNOSIS — S41112A Laceration without foreign body of left upper arm, initial encounter: Secondary | ICD-10-CM | POA: Diagnosis not present

## 2019-02-13 DIAGNOSIS — W293XXA Contact with powered garden and outdoor hand tools and machinery, initial encounter: Secondary | ICD-10-CM | POA: Diagnosis not present

## 2019-02-13 MED ORDER — LIDOCAINE HCL 2 % IJ SOLN
20.0000 mL | Freq: Once | INTRAMUSCULAR | Status: AC
  Administered 2019-02-13: 400 mg
  Filled 2019-02-13: qty 20

## 2019-02-13 NOTE — Discharge Instructions (Addendum)
Please read attached information. If you experience any new or worsening signs or symptoms please return to the emergency room for evaluation. Please follow-up with your primary care provider or specialist as discussed.  °

## 2019-02-13 NOTE — ED Notes (Signed)
Patient verbalizes understanding of discharge instructions. Opportunity for questioning and answers were provided. Armband removed by staff, pt discharged from ED.  

## 2019-02-13 NOTE — ED Notes (Signed)
No answer for vitals recheck x2 

## 2019-02-13 NOTE — ED Notes (Signed)
No answer for vitals recheck x1 
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
## 2019-03-29 DIAGNOSIS — M5441 Lumbago with sciatica, right side: Secondary | ICD-10-CM | POA: Diagnosis not present

## 2019-03-29 DIAGNOSIS — G8929 Other chronic pain: Secondary | ICD-10-CM | POA: Diagnosis not present

## 2019-03-29 DIAGNOSIS — M5442 Lumbago with sciatica, left side: Secondary | ICD-10-CM | POA: Diagnosis not present

## 2019-03-29 DIAGNOSIS — G5692 Unspecified mononeuropathy of left upper limb: Secondary | ICD-10-CM | POA: Diagnosis not present

## 2019-03-29 DIAGNOSIS — Z13220 Encounter for screening for lipoid disorders: Secondary | ICD-10-CM | POA: Diagnosis not present

## 2019-03-29 DIAGNOSIS — Z0000 Encounter for general adult medical examination without abnormal findings: Secondary | ICD-10-CM | POA: Diagnosis not present

## 2019-04-25 ENCOUNTER — Telehealth: Payer: Self-pay | Admitting: Pharmacy Technician

## 2019-04-25 ENCOUNTER — Ambulatory Visit (INDEPENDENT_AMBULATORY_CARE_PROVIDER_SITE_OTHER): Payer: Medicaid Other | Admitting: Internal Medicine

## 2019-04-25 ENCOUNTER — Other Ambulatory Visit: Payer: Self-pay

## 2019-04-25 ENCOUNTER — Encounter: Payer: Self-pay | Admitting: Internal Medicine

## 2019-04-25 DIAGNOSIS — B182 Chronic viral hepatitis C: Secondary | ICD-10-CM | POA: Diagnosis not present

## 2019-04-25 NOTE — Telephone Encounter (Signed)
[facility] Patient Advocate Encounter   Received notification from [place] MEDICAID that prior authorization for Hepatitis C medication is required.   Needed readiness form has been signed.  We will process once medication is written.  Ridhi Hoffert E. Meral Geissinger, CPhT Specialty Pharmacy Patient Advocate [facility] Phone: [phone] Fax:  [phone]   

## 2019-04-25 NOTE — Patient Instructions (Signed)
Date 04/25/19  Dear Mr. Zheng, As discussed in the [facility], your hepatitis C therapy will include the following medications:          Mavyret (glecaprevir 100 mg/pibrentasvir 40 mg): Take 3 tablets by mouth once daily for 8 or 12 weeks ---------------------------------------------------------------- Your HCV Treatment Start Date: TBA   Your HCV genotype: unknown    Liver Fibrosis: TBD    ---------------------------------------------------------------- YOUR PHARMACY CONTACT:   [facility] [address] [place], [place] [address] Phone: [phone] Hours: Monday to Friday 7:30 am to 6:00 pm   Please always contact your pharmacy at least 3-4 business days before you run out of medications to ensure your next month's medication is ready or 1 week prior to running out if you receive it by mail.  Remember, each prescription is for 28 days. ---------------------------------------------------------------- GENERAL NOTES REGARDING YOUR HEPATITIS C MEDICATION:  Mavyret: - tablets are pink, oblong shape - take 3 tablets daily with food. - The tablets should be stored at room temperature.  - Acid reducing agents such as H2 blockers (ie. Pepcid (famotidine), Zantac (ranitidine), Tagamet (cimetidine), Axid (nizatidine) and proton pump inhibitors (ie. Prilosec (omeprazole), Protonix (pantoprazole), Nexium (esomeprazole), or Aciphex (rabeprazole)) can decrease effectiveness of Harvoni. Do not take until you have discussed with a health care provider.    -Antacids that contain magnesium and/or aluminum hydroxide (ie. Milk of Magensia, Rolaids, Gaviscon, Maalox, Mylanta, an dArthritis Pain Formula) can reduce absorption of Harvoni, so take them at least 4 hours before or after Harvoni.  -Calcium carbonate (calcium supplements or antacids such as Tums, Caltrate, Os-Cal) needs to be taken at least 4 hours hours before or after Harvoni.  -St. John's wort or any products that  contain St. John's wort like some herbal supplements  Please inform the office prior to starting any of these medications.  - The common side effects associated with Mavyret include:      1. Fatigue      2. Headache      3. Nausea      4. Diarrhea      5. Insomnia  Please note that this only lists the most common side effects and is NOT a comprehensive list of the potential side effects of these medications. For more information, please review the drug information sheets that come with your medication package from the pharmacy.  ---------------------------------------------------------------- GENERAL HELPFUL HINTS ON HCV THERAPY: 1. Stay well-hydrated. 2. Notify the ID Clinic of any changes in your other over-the-counter/herbal or prescription medications. 3. If you miss a dose of your medication, take the missed dose as soon as you remember. Return to your regular time/dose schedule the next day.  4.  Do not stop taking your medications without first talking with your healthcare provider. 5.  You may take Tylenol (acetaminophen), as long as the dose is less than 2000 mg (OR no more than 4 tablets of the Tylenol Extra Strengths 500mg  tablet) in 24 hours. 6.  You will see our pharmacist-specialist within the first 2 weeks of starting your medication to monitor for any possible side effects. 7.  You will have labs once during treatment, after soon after treatment completion and one final lab 6 months after treatment completion to verify the virus is out of your system.  Thayer Headings, [place] [facility] [place], [place]  [phone]

## 2019-04-25 NOTE — Telephone Encounter (Signed)
[facility] Patient Advocate Encounter  Insurance verification completed.    The patient is insured through [place] [employer]ICA[employer] and has a $3 copay.    Callum Wolf E. Maxamus Colao, CPhT Specialty Pharmacy Patient Advocate [facility] Phone: [phone] Fax:  [phone]   

## 2019-04-25 NOTE — Progress Notes (Addendum)
[place] for [facility] Disease   CC: consideration for treatment for chronic hepatitis C  HPI:  +Donald Davies is a 38 y.o. male who presents for initial evaluation and management of chronic hepatitis C.  Patient tested positive last month during routine screening at his PCP. Hepatitis C-associated risk factors present are: IV drug abuse (details: over 8 years ago). Patient denies multiple sexual partners. Patient has had other studies performed. Results: hepatitis C Ab positive. Patient has not had prior treatment for Hepatitis C. Patient does not have a past history of liver disease. Patient does not have a family history of liver disease. Patient does not  have associated signs or symptoms related to liver disease.  Labs reviewed and confirm chronic hepatitis C with a positive Ab.   Records reviewed from PCP and positive Ab, CMP and CBC wnl, AST 21, ALT 8, Plt 284, HIV negative    Patient does not have documented immunity to Hepatitis A. Patient does not have documented immunity to Hepatitis B.    Review of Systems:  Constitutional: negative for malaise and anorexia Gastrointestinal: negative for nausea and diarrhea Integument/breast: negative for rash All other systems reviewed and are negative       Past Medical History:  Diagnosis Date  . Seizures ([place])     Prior to Admission medications   Medication Sig Start Date End Date Taking? Authorizing Provider  ibuprofen (ADVIL,MOTRIN) 800 MG tablet Take 1 tablet (800 mg total) by mouth 3 (three) times daily. 03/12/18  Yes Caryl Ada K, PA-C  polyethylene glycol (MIRALAX / GLYCOLAX) packet Take 17 g by mouth daily. Hold for diarrhea 10/09/14  Yes Barton Dubois, MD  clindamycin (CLEOCIN) 150 MG capsule Take 1 capsule (150 mg total) by mouth every 6 (six) hours. Patient not taking: Reported on 04/25/2019 09/18/16   Lily Kocher, PA-C  HYDROcodone-acetaminophen (NORCO/VICODIN) 5-325 MG tablet One tablet every six hours for  pain.  Limit 7 days. Patient not taking: Reported on 04/25/2019 03/23/18   Sanjuana Kava, MD  nicotine (NICODERM CQ - DOSED IN MG/24 HR) 7 mg/24hr patch Place 1 patch (7 mg total) onto the skin daily. Patient not taking: Reported on 04/25/2019 10/09/14   Barton Dubois, MD    Allergies  Allergen Reactions  . Ritalin [Methylphenidate Hcl]   . Robitussin (Alcohol Free) [Guaifenesin]     Social History   Tobacco Use  . Smoking status: Current Every Day Smoker    Packs/day: 1.00    Types: Cigarettes  . Smokeless tobacco: Never Used  Substance Use Topics  . Alcohol use: No  . Drug use: No    Family History  Problem Relation Age of Onset  . Diabetes Mother   . Multiple sclerosis Mother   . Arthritis Mother   . COPD Mother   . Heart disease Mother      Objective:  Constitutional: in no apparent distress,  Vitals:   04/25/19 0922  BP: 103/61  Pulse: 78  Temp: 97.9 F (36.6 C)   Eyes: anicteric Cardiovascular: Cor RRR Respiratory: CTA B; normal respiratory effort Gastrointestinal: soft Musculoskeletal: no pedal edema noted Skin: negatives: no rash; no porphyria cutanea tarda Lymphatic: no cervical lymphadenopathy   Laboratory Genotype: No results found for: HCVGENOTYPE HCV viral load: No results found for: HCVQUANT Lab Results  Component Value Date   WBC 7.9 10/08/2014   HGB 13.7 10/08/2014   HCT 41.4 10/08/2014   MCV 89.2 10/08/2014   PLT 240 10/08/2014    Lab  Results  Component Value Date   CREATININE 0.90 10/08/2014   BUN 6 10/08/2014   NA 140 10/08/2014   K 4.2 10/08/2014   CL 105 10/08/2014   CO2 27 10/08/2014   No results found for: ALT, AST, GGT, ALKPHOS   Labs and history reviewed and show CHILD-PUGH A  5-6 points: Child class A 7-9 points: Child class B 10-15 points: Child class C  No results found for: INR, BILITOT, ALBUMIN   Assessment: New Patient with Chronic Hepatitis C genotype unknown, untreated.  I discussed with the patient the  lab findings that confirm chronic hepatitis C as well as the natural history and progression of disease including about 30% of people who develop cirrhosis of the liver if left untreated and once cirrhosis is established there is a 2-7% risk per year of liver cancer and liver failure.  I discussed the importance of treatment and benefits in reducing the risk, even if significant liver fibrosis exists.   Plan: 1) Patient counseled extensively on limiting acetaminophen to no more than 2 grams daily, avoidance of alcohol. 2) Transmission discussed with patient including sexual transmission, sharing razors and toothbrush.   3) Will need referral to gastroenterology if concern for cirrhosis 4) Will need referral for substance abuse counseling: No.; Further work up to include urine drug screen  No. 5) Will prescribe appropriate medication based on genotype and coverage - will use Mavyret based on coverage once labs and ultrasound done.  6) Hepatitis A and B titers 7) Pneumovax vaccine if not previously given - we are out so this will be deferred to the next visit 9) Further work up to include liver staging with elastography 10) will follow up after starting medication

## 2019-05-01 ENCOUNTER — Ambulatory Visit (HOSPITAL_COMMUNITY)
Admission: RE | Admit: 2019-05-01 | Discharge: 2019-05-01 | Disposition: A | Discharge status: Home / Self Care | Payer: Medicaid Other | Source: Ambulatory Visit | Attending: Internal Medicine | Admitting: Internal Medicine

## 2019-05-01 ENCOUNTER — Other Ambulatory Visit: Payer: Self-pay

## 2019-05-01 DIAGNOSIS — F411 Generalized anxiety disorder: Secondary | ICD-10-CM | POA: Diagnosis not present

## 2019-05-01 DIAGNOSIS — B182 Chronic viral hepatitis C: Secondary | ICD-10-CM | POA: Diagnosis not present

## 2019-05-01 DIAGNOSIS — Z79891 Long term (current) use of opiate analgesic: Secondary | ICD-10-CM | POA: Diagnosis not present

## 2019-05-02 ENCOUNTER — Telehealth: Payer: Self-pay

## 2019-05-02 LAB — LIVER FIBROSIS, FIBROTEST-ACTITEST
ALT: 8 U/L — ABNORMAL LOW (ref 9–46)
Alpha-2-Macroglobulin: 182 mg/dL (ref 106–279)
Apolipoprotein A1: 122 mg/dL (ref 94–176)
Bilirubin: 0.5 mg/dL (ref 0.2–1.2)
Fibrosis Score: 0.1
GGT: 9 U/L (ref 3–90)
Haptoglobin: 177 mg/dL (ref 43–212)
Necroinflammat ACT Score: 0.01
Reference ID: 3192614

## 2019-05-02 LAB — HEPATITIS C RNA QUANTITATIVE
HCV Quantitative Log: <1.18 Log IU/mL
HCV RNA, PCR, QN: <15 IU/mL

## 2019-05-02 LAB — HEPATITIS B SURFACE ANTIGEN: Hepatitis B Surface Ag: NONREACTIVE

## 2019-05-02 LAB — HEPATITIS A ANTIBODY, TOTAL: Hepatitis A AB,Total: NONREACTIVE

## 2019-05-02 LAB — HEPATITIS B CORE ANTIBODY, TOTAL: Hep B Core Total Ab: NONREACTIVE

## 2019-05-02 LAB — HEPATITIS C GENOTYPE: HCV Genotype: NOT DETECTED

## 2019-05-02 LAB — PROTIME-INR
INR: 1
Prothrombin Time: 10.6 s (ref 9.0–11.5)

## 2019-05-02 LAB — HEPATITIS B SURFACE ANTIBODY,QUALITATIVE: Hep B S Ab: REACTIVE — AB

## 2019-05-02 NOTE — Telephone Encounter (Signed)
-----   Message from Thayer Headings, MD sent at 05/02/2019  9:31 AM EST ----- Please let him know that his hepatitis C is no longer active and spontaneously cleared so he does not have hepatitis C anymore and does not need treatment.   His ultrasound and liver tests also look good, no liver damage/issues.  thanks

## 2019-05-02 NOTE — Telephone Encounter (Signed)
Attempted to call patient, was not able to leave a voice message. Will try again later [facility]

## 2019-05-04 DIAGNOSIS — F1124 Opioid dependence with opioid-induced mood disorder: Secondary | ICD-10-CM | POA: Diagnosis not present

## 2019-05-04 NOTE — Telephone Encounter (Signed)
Attempted to call patient x3 separate attempts, unable to reach.  Donald Davies

## 2019-05-15 DIAGNOSIS — Z79891 Long term (current) use of opiate analgesic: Secondary | ICD-10-CM | POA: Diagnosis not present

## 2019-05-15 DIAGNOSIS — F1124 Opioid dependence with opioid-induced mood disorder: Secondary | ICD-10-CM | POA: Diagnosis not present

## 2019-05-15 NOTE — Telephone Encounter (Signed)
[facility] Patient Advocate Encounter  Relayed the message today on a phone call that the patient will not need medication for treatment.

## 2019-05-29 DIAGNOSIS — F1124 Opioid dependence with opioid-induced mood disorder: Secondary | ICD-10-CM | POA: Diagnosis not present

## 2019-05-29 DIAGNOSIS — F411 Generalized anxiety disorder: Secondary | ICD-10-CM | POA: Diagnosis not present

## 2019-06-01 DIAGNOSIS — F1124 Opioid dependence with opioid-induced mood disorder: Secondary | ICD-10-CM | POA: Diagnosis not present

## 2019-06-26 DIAGNOSIS — F1124 Opioid dependence with opioid-induced mood disorder: Secondary | ICD-10-CM | POA: Diagnosis not present

## 2019-06-26 DIAGNOSIS — F411 Generalized anxiety disorder: Secondary | ICD-10-CM | POA: Diagnosis not present

## 2019-07-24 DIAGNOSIS — F1124 Opioid dependence with opioid-induced mood disorder: Secondary | ICD-10-CM | POA: Diagnosis not present

## 2019-07-24 DIAGNOSIS — F411 Generalized anxiety disorder: Secondary | ICD-10-CM | POA: Diagnosis not present

## 2019-08-21 DIAGNOSIS — F1124 Opioid dependence with opioid-induced mood disorder: Secondary | ICD-10-CM | POA: Diagnosis not present

## 2019-08-21 DIAGNOSIS — F411 Generalized anxiety disorder: Secondary | ICD-10-CM | POA: Diagnosis not present

## 2019-08-26 ENCOUNTER — Ambulatory Visit
Admission: EM | Admit: 2019-08-26 | Discharge: 2019-08-26 | Disposition: A | Discharge status: Home / Self Care | Payer: Medicaid Other

## 2019-08-26 DIAGNOSIS — L509 Urticaria, unspecified: Secondary | ICD-10-CM | POA: Diagnosis not present

## 2019-08-26 MED ORDER — METHYLPREDNISOLONE SODIUM SUCC 125 MG IJ SOLR
125.0000 mg | Freq: Once | INTRAMUSCULAR | Status: AC
  Administered 2019-08-26: 16:00:00 125 mg via INTRAMUSCULAR

## 2019-08-26 MED ORDER — CETIRIZINE HCL 10 MG PO TABS: 10.0000 mg | ORAL_TABLET | Freq: Every day | ORAL | 0 refills | Status: DC

## 2019-08-26 MED ORDER — PREDNISONE 10 MG (21) PO TBPK: ORAL_TABLET | ORAL | 0 refills | Status: DC

## 2019-08-26 NOTE — ED Provider Notes (Signed)
[facility]    CSN: [id number] Arrival date & time: 08/26/19  1522      History   Chief Complaint Chief Complaint  Patient presents with  . Rash    HPI Donald Davies is a 39 y.o. male.   Who presents to the urgent care with a complaint of rash for the past 2 to 3 days.  He denies changes in soaps, detergents, or anyone with similar symptoms.  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  He localizes the rash to his face, upper arm, abdomen.  She describes it as itchy, red scaling and vesicular.  Has not used any OTC medication.  Symptoms are made worse with heat.denies similar symptoms in the past.  Denies chills, fever, nausea, vomiting, diarrhea, chest pain, chest tightness.  The history is provided by the patient. No language interpreter was used.  Rash   Past Medical History:  Diagnosis Date  . Seizures [facility])     Patient Active Problem List   Diagnosis Date Noted  . Chronic hepatitis C without hepatic coma (HCC) 04/25/2019  . Dental abscess   . Insomnia   . CN (constipation)   . Facial abscess 10/04/2014  . Cellulitis and abscess of face 10/04/2014  . Leukocytosis 10/04/2014  . Tobacco use disorder 10/04/2014  . DVT prophylaxis 10/04/2014  . Facial infection     Past Surgical History:  Procedure Laterality Date  . MULTIPLE EXTRACTIONS WITH ALVEOLOPLASTY N/A 10/08/2014   Procedure: Extraction of tooth #'s [phone] with alveoloplasty;  Surgeon: Charlynne Pander, DDS;  Location: [facility];  Service: Oral Surgery;  Laterality: N/A;       Home Medications    Prior to Admission medications   Medication Sig Start Date End Date Taking? Authorizing Provider  gabapentin (NEURONTIN) 300 MG capsule Take by mouth. 03/29/19  Yes [provider]  ALPRAZolam (XANAX) 1 MG tablet Take 1 mg by mouth 3 (three) times daily. 07/24/19   [provider]  buprenorphine (SUBUTEX) 8 MG SUBL SL tablet 8 mg 2 (two) times daily. 07/24/19   [provider]  clindamycin (CLEOCIN) 150 MG capsule Take 1 capsule (150 mg total) by mouth every 6 (six) hours. Patient not taking: Reported on 04/25/2019 09/18/16   Ivery Quale, PA-C  HYDROcodone-acetaminophen (NORCO/VICODIN) 5-325 MG tablet One tablet every six hours for pain.  Limit 7 days. Patient not taking: Reported on 04/25/2019 03/23/18   Darreld Mclean, MD  ibuprofen (ADVIL,MOTRIN) 800 MG tablet Take 1 tablet (800 mg total) by mouth 3 (three) times daily. 03/12/18   Elson Areas, PA-C  nicotine (NICODERM CQ - DOSED IN MG/24 HR) 7 mg/24hr patch Place 1 patch (7 mg total) onto the skin daily. Patient not taking: Reported on 04/25/2019 10/09/14   Vassie Loll, MD  polyethylene glycol [facility]) packet Take 17 g by mouth daily. Hold for diarrhea 10/09/14   Vassie Loll, MD    Family History Family History  Problem Relation Age of Onset  . Diabetes Mother   . Multiple sclerosis Mother   . Arthritis Mother   . COPD Mother   . Heart disease Mother     Social History Social History   Tobacco Use  . Smoking status: Current Every Day Smoker    Packs/day: 1.00    Types: Cigarettes  . Smokeless tobacco: Never Used  Substance Use Topics  . Alcohol use: No  . Drug use: Not Currently     Allergies   Ritalin [methylphenidate  hcl] and Robitussin (alcohol free) [guaifenesin]   Review of Systems Review of Systems  Constitutional: Negative.   Respiratory: Negative.   Cardiovascular: Negative.   Skin: Positive for color change and rash.  All other systems reviewed and are negative.    Physical Exam Triage Vital Signs ED Triage Vitals  Enc Vitals Group     BP 08/26/19 1536 109/68     Pulse Rate 08/26/19 1536 67     Resp 08/26/19 1536 18     Temp 08/26/19 1536 98.2 F (36.8 C)     Temp src --      SpO2 08/26/19 1536 96 %     Weight --      Height --      Head Circumference --      Peak Flow --      Pain Score 08/26/19 1533 0     Pain Loc --      Pain  Edu? --      Excl. in GC? --    No data found.  Updated Vital Signs BP 109/68   Pulse 67   Temp 98.2 F (36.8 C)   Resp 18   SpO2 96%   Visual Acuity Right Eye Distance:   Left Eye Distance:   Bilateral Distance:    Right Eye Near:   Left Eye Near:    Bilateral Near:     Physical Exam Vitals and nursing note reviewed.  Constitutional:      General: He is not in acute distress.    Appearance: Normal appearance. He is normal weight. He is not ill-appearing, toxic-appearing or diaphoretic.  Cardiovascular:     Rate and Rhythm: Normal rate and regular rhythm.     Pulses: Normal pulses.     Heart sounds: Normal heart sounds. No murmur. No friction rub. No gallop.   Pulmonary:     Effort: Pulmonary effort is normal. No respiratory distress.     Breath sounds: Normal breath sounds. No stridor. No wheezing, rhonchi or rales.  Chest:     Chest wall: No tenderness.  Skin:    General: Skin is warm.     Capillary Refill: Capillary refill takes less than 2 seconds.     Findings: Rash present. Rash is macular and vesicular.  Neurological:     Mental Status: He is alert and oriented to person, place, and time.     Sensory: No sensory deficit.      UC Treatments / Results  Labs (all labs ordered are listed, but only abnormal results are displayed) Labs Reviewed - No data to display  EKG   Radiology No results found.  Procedures Procedures (including critical care time)  Medications Ordered in UC Medications  methylPREDNISolone sodium succinate (SOLU-MEDROL) 125 mg/2 mL injection 125 mg (has no administration in time range)    Initial Impression / Assessment and Plan / UC Course  I have reviewed the triage vital signs and the nursing notes.  Pertinent labs & imaging results that were available during my care of the patient were reviewed by me and considered in my medical decision making (see chart for details).    Patient is stable for discharge. Solu-Medrol IM  will be given in office.  Prednisone taper and Zyrtec will be prescribed..  Was advised to follow-up with PCP  Final Clinical Impressions(s) / UC Diagnoses   Final diagnoses:  Hives     Discharge Instructions     Solu-Medrol IM was given in office Prednisone and  Zyrtec were prescribed Follow-up with PCP Return or go to ED for worsening of symptoms    ED Prescriptions    None     PDMP not reviewed this encounter.   Emerson Monte, FNP 08/26/19 1550

## 2019-08-26 NOTE — Discharge Instructions (Signed)
Solu-Medrol IM was given in office Prednisone and Zyrtec were prescribed Follow-up with PCP Return or go to ED for worsening of symptoms

## 2019-08-26 NOTE — ED Triage Notes (Signed)
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

## 2019-09-18 DIAGNOSIS — F1124 Opioid dependence with opioid-induced mood disorder: Secondary | ICD-10-CM | POA: Diagnosis not present

## 2019-09-18 DIAGNOSIS — F1219 Cannabis abuse with unspecified cannabis-induced disorder: Secondary | ICD-10-CM | POA: Diagnosis not present

## 2019-09-18 DIAGNOSIS — F411 Generalized anxiety disorder: Secondary | ICD-10-CM | POA: Diagnosis not present

## 2019-10-06 ENCOUNTER — Other Ambulatory Visit: Payer: Self-pay

## 2019-10-06 ENCOUNTER — Ambulatory Visit
Admission: EM | Admit: 2019-10-06 | Discharge: 2019-10-06 | Disposition: A | Discharge status: Home / Self Care | Payer: Medicaid Other | Attending: Family Medicine | Admitting: Family Medicine

## 2019-10-06 DIAGNOSIS — M7918 Myalgia, other site: Secondary | ICD-10-CM | POA: Diagnosis not present

## 2019-10-06 DIAGNOSIS — L0291 Cutaneous abscess, unspecified: Secondary | ICD-10-CM | POA: Insufficient documentation

## 2019-10-06 MED ORDER — DOXYCYCLINE HYCLATE 100 MG PO CAPS: 100.0000 mg | ORAL_CAPSULE | Freq: Two times a day (BID) | ORAL | 0 refills | Status: DC

## 2019-10-06 NOTE — ED Triage Notes (Signed)
Pt c/o abscess to right buttocks x 3 days, patient unable to see area

## 2019-10-06 NOTE — Discharge Instructions (Signed)
Keep dry and covered for next 24-48 hours  Warm compresses 3-4x daily for 10-15 minutes.  You may then wash site daily with warm water and mild soap Keep covered to avoid friction  Take antibiotic as prescribed and to completion  Return sooner or go to the ED if you have any new or worsening symptoms such as increased redness, swelling, pain, nausea, vomiting, fever, chills  

## 2019-10-06 NOTE — ED Provider Notes (Signed)
[facility]   [id number] 10/06/19 Arrival Time: 1412   ME:QASTMHD  SUBJECTIVE:  Donald Davies is a 39 y.o. male who presents with a possible abscess of his  R buttock. Onset gradual, approximately 1 year ago. Reports that he has had an abscess in this area before. Reports that it has gotten bigger and more painful over the last few days. Has made no attempts to treat at home. Has hx Hep C, abscesses, and is a current daily smoker.  ROS: As per HPI.  All other pertinent ROS negative.     Past Medical History:  Diagnosis Date  . Seizures (HCC)    Past Surgical History:  Procedure Laterality Date  . MULTIPLE EXTRACTIONS WITH ALVEOLOPLASTY N/A 10/08/2014   Procedure: Extraction of tooth #'s [phone] with alveoloplasty;  Surgeon: Charlynne Pander, DDS;  Location: [facility] OR;  Service: Oral Surgery;  Laterality: N/A;   Allergies  Allergen Reactions  . Ritalin [Methylphenidate Hcl]   . Robitussin (Alcohol Free) [Guaifenesin]    No current facility-administered medications on file prior to encounter.   Current Outpatient Medications on File Prior to Encounter  Medication Sig Dispense Refill  . ALPRAZolam (XANAX) 1 MG tablet Take 1 mg by mouth 3 (three) times daily.    . buprenorphine (SUBUTEX) 8 MG SUBL SL tablet 8 mg 2 (two) times daily.     Social History   Socioeconomic History  . Marital status: Single    Spouse name: Not on file  . Number of children: Not on file  . Years of education: Not on file  . Highest education level: Not on file  Occupational History  . Not on file  Tobacco Use  . Smoking status: Current Every Day Smoker    Packs/day: 1.00    Types: Cigarettes  . Smokeless tobacco: Never Used  Substance and Sexual Activity  . Alcohol use: No  . Drug use: Not Currently  . Sexual activity: Not on file  Other Topics Concern  . Not on file  Social History Narrative  . Not on file   Social Determinants of Health   Financial  Resource Strain:   . Difficulty of Paying Living Expenses:   Food Insecurity:   . Worried About Programme researcher, broadcasting/film/video in the Last Year:   . Barista in the Last Year:   Transportation Needs:   . Freight forwarder (Medical):   Marland Kitchen Lack of Transportation (Non-Medical):   Physical Activity:   . Days of Exercise per Week:   . Minutes of Exercise per Session:   Stress:   . Feeling of Stress :   Social Connections:   . Frequency of Communication with Friends and Family:   . Frequency of Social Gatherings with Friends and Family:   . Attends Religious Services:   . Active Member of Clubs or Organizations:   . Attends Banker Meetings:   Marland Kitchen Marital Status:   Intimate Partner Violence:   . Fear of Current or Ex-Partner:   . Emotionally Abused:   Marland Kitchen Physically Abused:   . Sexually Abused:    Family History  Problem Relation Age of Onset  . Diabetes Mother   . Multiple sclerosis Mother   . Arthritis Mother   . COPD Mother   . Heart disease Mother     OBJECTIVE:  Vitals:   10/06/19 1423  BP: 127/71  Pulse: 81  Resp: 18  Temp: 98.1 F (36.7 C)  SpO2: 97%  General appearance: alert; no distress Skin: 4 x 2 cm induration of his L buttock, tender to touch; no active drainage Psychological: alert and cooperative; normal mood and affect  Procedure: Verbal consent obtained. Area over induration cleaned with betadine. Lidocaine 2% without epinephrine used to obtain local anesthesia. The most fluctuant portion of the abscess was incised with a #11 blade scalpel. Abscess cavity explored and evacuated. Loculations broken up with a curved hemostat as best as possible given patient discomfort. Dressed with a clean gauze dressing. Minimal bleeding. No complications.  ASSESSMENT & PLAN:  1. Buttock pain   2. Abscess     Meds ordered this encounter  Medications  . doxycycline (VIBRAMYCIN) 100 MG capsule    Sig: Take 1 capsule (100 mg total) by mouth 2 (two)  times daily.    Dispense:  14 capsule    Refill:  0    Order Specific Question:   Supervising Provider    Answer:   Chase Picket [id number]    Prescribed doxycycline Take to completion Swabbed the area. Abscess with incision and drainage:  Keep dry and covered for next 24-48 hours Warm compresses 3-4x daily for 10-15 minutes.  You may then wash site daily with warm water and mild soap Keep covered to avoid friction Take antibiotic as prescribed and to completion Return sooner or go to the ED if you have any new or worsening symptoms such as increased redness, swelling, pain, nausea, vomiting, fever, chills.   Reviewed expectations re: course of current medical issues. Questions answered. Outlined signs and symptoms indicating need for more acute intervention. Patient verbalized understanding.  After Visit Summary given.          Faustino Congress, NP 10/06/19 1636

## 2019-10-09 LAB — AEROBIC CULTURE W GRAM STAIN (SUPERFICIAL SPECIMEN): Culture: NO GROWTH

## 2019-10-20 DIAGNOSIS — F411 Generalized anxiety disorder: Secondary | ICD-10-CM | POA: Diagnosis not present

## 2019-10-20 DIAGNOSIS — F1124 Opioid dependence with opioid-induced mood disorder: Secondary | ICD-10-CM | POA: Diagnosis not present

## 2019-10-20 DIAGNOSIS — F1219 Cannabis abuse with unspecified cannabis-induced disorder: Secondary | ICD-10-CM | POA: Diagnosis not present

## 2019-11-17 DIAGNOSIS — F1219 Cannabis abuse with unspecified cannabis-induced disorder: Secondary | ICD-10-CM | POA: Diagnosis not present

## 2019-11-17 DIAGNOSIS — F1124 Opioid dependence with opioid-induced mood disorder: Secondary | ICD-10-CM | POA: Diagnosis not present

## 2019-11-17 DIAGNOSIS — F411 Generalized anxiety disorder: Secondary | ICD-10-CM | POA: Diagnosis not present

## 2019-12-15 DIAGNOSIS — F411 Generalized anxiety disorder: Secondary | ICD-10-CM | POA: Diagnosis not present

## 2019-12-15 DIAGNOSIS — F1219 Cannabis abuse with unspecified cannabis-induced disorder: Secondary | ICD-10-CM | POA: Diagnosis not present

## 2019-12-15 DIAGNOSIS — F1124 Opioid dependence with opioid-induced mood disorder: Secondary | ICD-10-CM | POA: Diagnosis not present

## 2020-01-12 DIAGNOSIS — F1219 Cannabis abuse with unspecified cannabis-induced disorder: Secondary | ICD-10-CM | POA: Diagnosis not present

## 2020-01-12 DIAGNOSIS — F411 Generalized anxiety disorder: Secondary | ICD-10-CM | POA: Diagnosis not present

## 2020-01-12 DIAGNOSIS — F1124 Opioid dependence with opioid-induced mood disorder: Secondary | ICD-10-CM | POA: Diagnosis not present

## 2020-02-12 DIAGNOSIS — F411 Generalized anxiety disorder: Secondary | ICD-10-CM | POA: Diagnosis not present

## 2020-02-12 DIAGNOSIS — F1124 Opioid dependence with opioid-induced mood disorder: Secondary | ICD-10-CM | POA: Diagnosis not present

## 2020-02-12 DIAGNOSIS — F1219 Cannabis abuse with unspecified cannabis-induced disorder: Secondary | ICD-10-CM | POA: Diagnosis not present

## 2020-03-08 DIAGNOSIS — F1124 Opioid dependence with opioid-induced mood disorder: Secondary | ICD-10-CM | POA: Diagnosis not present

## 2020-03-08 DIAGNOSIS — F1219 Cannabis abuse with unspecified cannabis-induced disorder: Secondary | ICD-10-CM | POA: Diagnosis not present

## 2020-03-08 DIAGNOSIS — F411 Generalized anxiety disorder: Secondary | ICD-10-CM | POA: Diagnosis not present

## 2020-04-05 DIAGNOSIS — F1219 Cannabis abuse with unspecified cannabis-induced disorder: Secondary | ICD-10-CM | POA: Diagnosis not present

## 2020-04-05 DIAGNOSIS — F1124 Opioid dependence with opioid-induced mood disorder: Secondary | ICD-10-CM | POA: Diagnosis not present

## 2020-04-05 DIAGNOSIS — F411 Generalized anxiety disorder: Secondary | ICD-10-CM | POA: Diagnosis not present

## 2020-05-13 DIAGNOSIS — F1219 Cannabis abuse with unspecified cannabis-induced disorder: Secondary | ICD-10-CM | POA: Diagnosis not present

## 2020-05-13 DIAGNOSIS — F1124 Opioid dependence with opioid-induced mood disorder: Secondary | ICD-10-CM | POA: Diagnosis not present

## 2020-05-13 DIAGNOSIS — F411 Generalized anxiety disorder: Secondary | ICD-10-CM | POA: Diagnosis not present

## 2020-06-09 ENCOUNTER — Other Ambulatory Visit: Payer: Self-pay

## 2020-06-09 ENCOUNTER — Encounter (HOSPITAL_COMMUNITY): Payer: Self-pay | Admitting: Emergency Medicine

## 2020-06-09 ENCOUNTER — Emergency Department (HOSPITAL_COMMUNITY)
Admission: EM | Admit: 2020-06-09 | Discharge: 2020-06-09 | Disposition: A | Discharge status: Home / Self Care | Payer: Medicaid Other | Attending: Emergency Medicine | Admitting: Emergency Medicine

## 2020-06-09 DIAGNOSIS — R222 Localized swelling, mass and lump, trunk: Secondary | ICD-10-CM | POA: Diagnosis present

## 2020-06-09 DIAGNOSIS — L0231 Cutaneous abscess of buttock: Secondary | ICD-10-CM | POA: Insufficient documentation

## 2020-06-09 DIAGNOSIS — F17210 Nicotine dependence, cigarettes, uncomplicated: Secondary | ICD-10-CM | POA: Diagnosis not present

## 2020-06-09 MED ORDER — DOXYCYCLINE HYCLATE 100 MG PO CAPS: 100.0000 mg | ORAL_CAPSULE | Freq: Two times a day (BID) | ORAL | 0 refills | Status: AC

## 2020-06-09 MED ORDER — DOXYCYCLINE HYCLATE 100 MG PO TABS
100.0000 mg | ORAL_TABLET | Freq: Once | ORAL | Status: AC
  Administered 2020-06-09: 100 mg via ORAL
  Filled 2020-06-09: qty 1

## 2020-06-09 MED ORDER — HYDROCODONE-ACETAMINOPHEN 5-325 MG PO TABS
2.0000 | ORAL_TABLET | Freq: Once | ORAL | Status: DC
  Filled 2020-06-09: qty 2

## 2020-06-09 MED ORDER — LIDOCAINE HCL (PF) 1 % IJ SOLN
INTRAMUSCULAR | Status: AC
  Filled 2020-06-09: qty 30

## 2020-06-09 NOTE — ED Triage Notes (Signed)
Pt c/o abscess to buttocks for the past month. Hx of same.

## 2020-06-09 NOTE — Discharge Instructions (Signed)
Begin taking doxycycline as prescribed.  Apply warm soaks were performed sitz bath's as frequently as possible for the next several days.  Return to the emergency department for increasing pain, high fever, increased swelling, or other new and concerning symptoms.

## 2020-06-09 NOTE — ED Provider Notes (Signed)
[facility] Provider Note   CSN: [id number] Arrival date & time: 06/09/20  0025     History Chief Complaint  Patient presents with  . Abscess    [facility] is a 40 y.o. male.  Patient is a 40 year old male with past medical history of hepatitis C, seizures.  He presents today for evaluation of right buttock pain and swelling.  This is apparently been worsening over the past month, and much worse over the past few days.  He denies fevers or chills.  He denies any difficulty with bowel movements.  The history is provided by the patient.  Abscess Abscess location: Buttock. Size:  3 cm x 3 cm Abscess quality: fluctuance, induration and painful   Abscess quality: not draining   Duration:  1 month Progression:  Worsening Pain details:    Quality:  Throbbing   Severity:  Severe   Timing:  Constant   Progression:  Worsening Context: not diabetes and not immunosuppression   Relieved by:  Nothing Exacerbated by: Sitting and palpation. Ineffective treatments:  Warm compresses      Past Medical History:  Diagnosis Date  . Seizures [facility])     Patient Active Problem List   Diagnosis Date Noted  . Chronic hepatitis C without hepatic coma (HCC) 04/25/2019  . Dental abscess   . Insomnia   . CN (constipation)   . Facial abscess 10/04/2014  . Cellulitis and abscess of face 10/04/2014  . Leukocytosis 10/04/2014  . Tobacco use disorder 10/04/2014  . DVT prophylaxis 10/04/2014  . Facial infection     Past Surgical History:  Procedure Laterality Date  . MULTIPLE EXTRACTIONS WITH ALVEOLOPLASTY N/A 10/08/2014   Procedure: Extraction of tooth #'s [phone] with alveoloplasty;  Surgeon: Charlynne Pander, DDS;  Location: [facility];  Service: Oral Surgery;  Laterality: N/A;       Family History  Problem Relation Age of Onset  . Diabetes Mother   . Multiple sclerosis Mother   . Arthritis Mother   . COPD Mother   . Heart disease Mother      Social History   Tobacco Use  . Smoking status: Current Every Day Smoker    Packs/day: 1.00    Types: Cigarettes  . Smokeless tobacco: Never Used  Substance Use Topics  . Alcohol use: No  . Drug use: Not Currently    Home Medications Prior to Admission medications   Medication Sig Start Date End Date Taking? Authorizing Provider  ALPRAZolam Prudy Feeler) 1 MG tablet Take 1 mg by mouth 3 (three) times daily. 07/24/19   [provider]  buprenorphine (SUBUTEX) 8 MG SUBL SL tablet 8 mg 2 (two) times daily. 07/24/19   [provider]  doxycycline (VIBRAMYCIN) 100 MG capsule Take 1 capsule (100 mg total) by mouth 2 (two) times daily. 10/06/19   Moshe Cipro, NP    Allergies    Ritalin [methylphenidate hcl] and Robitussin (alcohol free) [guaifenesin]  Review of Systems   Review of Systems  All other systems reviewed and are negative.   Physical Exam Updated Vital Signs BP 134/89 (BP Location: Left Arm)   Pulse 88   Temp 98.2 F (36.8 C) (Oral)   Resp 18   Ht 6' (1.829 m)   Wt 63.5 kg   SpO2 97%   BMI 18.99 kg/m   Physical Exam Vitals and nursing note reviewed.  Constitutional:      General: He is not in acute distress.    Appearance: Normal  appearance. He is not ill-appearing.  HENT:     Head: Normocephalic and atraumatic.  Pulmonary:     Effort: Pulmonary effort is normal.  Skin:    Comments: The inside of the right buttock contains an area of firmness and fluctuance approximately 3 cm x 3 cm.  This is remote from, and not contiguous with the rectum/anus.  Neurological:     Mental Status: He is alert.     ED Results / Procedures / Treatments   Labs (all labs ordered are listed, but only abnormal results are displayed) Labs Reviewed - No data to display  EKG None  Radiology No results found.  Procedures Procedures (including critical care time)  Medications Ordered in ED Medications  HYDROcodone-acetaminophen (NORCO/VICODIN)  5-325 MG per tablet 2 tablet (has no administration in time range)  doxycycline (VIBRA-TABS) tablet 100 mg (has no administration in time range)  lidocaine (PF) (XYLOCAINE) 1 % injection (  Given 06/09/20 0112)    ED Course  I have reviewed the triage vital signs and the nursing notes.  Pertinent labs & imaging results that were available during my care of the patient were reviewed by me and considered in my medical decision making (see chart for details).    MDM Rules/Calculators/A&P  The abscess was incised and drained as below.  Patient tolerated the procedure well.  He will be treated with doxycycline, sitz bath/warm compresses, and return as needed.  INCISION AND DRAINAGE Performed by: Geoffery Lyons Consent: Verbal consent obtained. Risks and benefits: risks, benefits and alternatives were discussed Type: abscess  Body area: right buttock  Anesthesia: local infiltration  Incision was made with a scalpel.  Local anesthetic: lidocaine 1% without epinephrine  Anesthetic total: 3 ml  Complexity: complex Blunt dissection to break up loculations  Drainage: purulent  Drainage amount: moderate  Packing material: none  Patient tolerance: Patient tolerated the procedure well with no immediate complications.     Final Clinical Impression(s) / ED Diagnoses Final diagnoses:  None    Rx / DC Orders ED Discharge Orders    None       Geoffery Lyons, MD 06/09/20 0120

## 2020-06-14 DIAGNOSIS — F411 Generalized anxiety disorder: Secondary | ICD-10-CM | POA: Diagnosis not present

## 2020-06-14 DIAGNOSIS — F1124 Opioid dependence with opioid-induced mood disorder: Secondary | ICD-10-CM | POA: Diagnosis not present

## 2020-06-14 DIAGNOSIS — F1219 Cannabis abuse with unspecified cannabis-induced disorder: Secondary | ICD-10-CM | POA: Diagnosis not present

## 2020-07-12 DIAGNOSIS — F411 Generalized anxiety disorder: Secondary | ICD-10-CM | POA: Diagnosis not present

## 2020-07-12 DIAGNOSIS — F1124 Opioid dependence with opioid-induced mood disorder: Secondary | ICD-10-CM | POA: Diagnosis not present

## 2020-07-12 DIAGNOSIS — F1219 Cannabis abuse with unspecified cannabis-induced disorder: Secondary | ICD-10-CM | POA: Diagnosis not present

## 2020-08-09 DIAGNOSIS — F411 Generalized anxiety disorder: Secondary | ICD-10-CM | POA: Diagnosis not present

## 2020-08-09 DIAGNOSIS — F1124 Opioid dependence with opioid-induced mood disorder: Secondary | ICD-10-CM | POA: Diagnosis not present

## 2020-09-06 DIAGNOSIS — Z79891 Long term (current) use of opiate analgesic: Secondary | ICD-10-CM | POA: Diagnosis not present

## 2020-09-06 DIAGNOSIS — F1124 Opioid dependence with opioid-induced mood disorder: Secondary | ICD-10-CM | POA: Diagnosis not present

## 2020-10-04 DIAGNOSIS — F1124 Opioid dependence with opioid-induced mood disorder: Secondary | ICD-10-CM | POA: Diagnosis not present

## 2020-10-04 DIAGNOSIS — Z79891 Long term (current) use of opiate analgesic: Secondary | ICD-10-CM | POA: Diagnosis not present

## 2020-10-18 DIAGNOSIS — F1124 Opioid dependence with opioid-induced mood disorder: Secondary | ICD-10-CM | POA: Diagnosis not present

## 2020-11-01 DIAGNOSIS — F1124 Opioid dependence with opioid-induced mood disorder: Secondary | ICD-10-CM | POA: Diagnosis not present

## 2020-11-01 DIAGNOSIS — Z79899 Other long term (current) drug therapy: Secondary | ICD-10-CM | POA: Diagnosis not present

## 2020-11-01 DIAGNOSIS — Z79891 Long term (current) use of opiate analgesic: Secondary | ICD-10-CM | POA: Diagnosis not present

## 2020-11-22 DIAGNOSIS — Z79891 Long term (current) use of opiate analgesic: Secondary | ICD-10-CM | POA: Diagnosis not present

## 2020-11-22 DIAGNOSIS — F411 Generalized anxiety disorder: Secondary | ICD-10-CM | POA: Diagnosis not present

## 2020-11-22 DIAGNOSIS — F1219 Cannabis abuse with unspecified cannabis-induced disorder: Secondary | ICD-10-CM | POA: Diagnosis not present

## 2020-11-22 DIAGNOSIS — F1124 Opioid dependence with opioid-induced mood disorder: Secondary | ICD-10-CM | POA: Diagnosis not present

## 2020-11-22 DIAGNOSIS — Z79899 Other long term (current) drug therapy: Secondary | ICD-10-CM | POA: Diagnosis not present

## 2020-12-27 DIAGNOSIS — Z79899 Other long term (current) drug therapy: Secondary | ICD-10-CM | POA: Diagnosis not present

## 2020-12-27 DIAGNOSIS — F1124 Opioid dependence with opioid-induced mood disorder: Secondary | ICD-10-CM | POA: Diagnosis not present

## 2020-12-27 DIAGNOSIS — Z79891 Long term (current) use of opiate analgesic: Secondary | ICD-10-CM | POA: Diagnosis not present

## 2021-01-24 DIAGNOSIS — Z79891 Long term (current) use of opiate analgesic: Secondary | ICD-10-CM | POA: Diagnosis not present

## 2021-01-24 DIAGNOSIS — Z79899 Other long term (current) drug therapy: Secondary | ICD-10-CM | POA: Diagnosis not present

## 2021-01-24 DIAGNOSIS — F1124 Opioid dependence with opioid-induced mood disorder: Secondary | ICD-10-CM | POA: Diagnosis not present

## 2021-02-21 DIAGNOSIS — Z79891 Long term (current) use of opiate analgesic: Secondary | ICD-10-CM | POA: Diagnosis not present

## 2021-02-21 DIAGNOSIS — Z79899 Other long term (current) drug therapy: Secondary | ICD-10-CM | POA: Diagnosis not present

## 2021-02-21 DIAGNOSIS — F1124 Opioid dependence with opioid-induced mood disorder: Secondary | ICD-10-CM | POA: Diagnosis not present

## 2021-03-21 DIAGNOSIS — Z79891 Long term (current) use of opiate analgesic: Secondary | ICD-10-CM | POA: Diagnosis not present

## 2021-03-21 DIAGNOSIS — Z79899 Other long term (current) drug therapy: Secondary | ICD-10-CM | POA: Diagnosis not present

## 2021-03-21 DIAGNOSIS — F1124 Opioid dependence with opioid-induced mood disorder: Secondary | ICD-10-CM | POA: Diagnosis not present

## 2021-04-14 DIAGNOSIS — Z79891 Long term (current) use of opiate analgesic: Secondary | ICD-10-CM | POA: Diagnosis not present

## 2021-04-14 DIAGNOSIS — Z79899 Other long term (current) drug therapy: Secondary | ICD-10-CM | POA: Diagnosis not present

## 2021-04-14 DIAGNOSIS — F1124 Opioid dependence with opioid-induced mood disorder: Secondary | ICD-10-CM | POA: Diagnosis not present

## 2021-05-23 DIAGNOSIS — F1124 Opioid dependence with opioid-induced mood disorder: Secondary | ICD-10-CM | POA: Diagnosis not present

## 2021-05-23 DIAGNOSIS — Z79891 Long term (current) use of opiate analgesic: Secondary | ICD-10-CM | POA: Diagnosis not present

## 2021-05-23 DIAGNOSIS — Z79899 Other long term (current) drug therapy: Secondary | ICD-10-CM | POA: Diagnosis not present

## 2021-06-23 DIAGNOSIS — Z79891 Long term (current) use of opiate analgesic: Secondary | ICD-10-CM | POA: Diagnosis not present

## 2021-06-23 DIAGNOSIS — F1124 Opioid dependence with opioid-induced mood disorder: Secondary | ICD-10-CM | POA: Diagnosis not present

## 2021-06-23 DIAGNOSIS — Z79899 Other long term (current) drug therapy: Secondary | ICD-10-CM | POA: Diagnosis not present

## 2021-07-21 DIAGNOSIS — F1124 Opioid dependence with opioid-induced mood disorder: Secondary | ICD-10-CM | POA: Diagnosis not present

## 2021-07-21 DIAGNOSIS — Z79891 Long term (current) use of opiate analgesic: Secondary | ICD-10-CM | POA: Diagnosis not present

## 2021-07-21 DIAGNOSIS — Z79899 Other long term (current) drug therapy: Secondary | ICD-10-CM | POA: Diagnosis not present

## 2021-08-16 IMAGING — US US ABDOMEN LIMITED W/ ELASTOGRAPHY
2 series · 12 of 25 positions shown · non-contrast
Comparison: None

CLINICAL DATA: Chronic hepatitis C.

EXAM:
US ABDOMEN LIMITED - RIGHT UPPER QUADRANT
ULTRASOUND HEPATIC ELASTOGRAPHY
TECHNIQUE: Sonography of the right upper quadrant was performed. In addition,
ultrasound elastography evaluation of the liver was performed. A
region of interest was placed within the right lobe of the liver.
Following application of a compressive sonographic pulse, tissue
compressibility was assessed. Multiple assessments were performed at
the selected site. Median tissue compressibility was determined.
Previously, hepatic stiffness was assessed by shear wave velocity.
Based on recently published Society of Radiologists in Ultrasound
consensus article, reporting is now recommended to be performed in
the SI units of pressure (kiloPascals) representing hepatic
stiffness/elasticity. The obtained result is compared to the
published reference standards. (cACLD= compensated Advanced Chronic
Liver Disease)

[Series 1: us abdomen limited w/ elastography · 10 of 74 slices shown (1 of 2)]
[im 4/74]
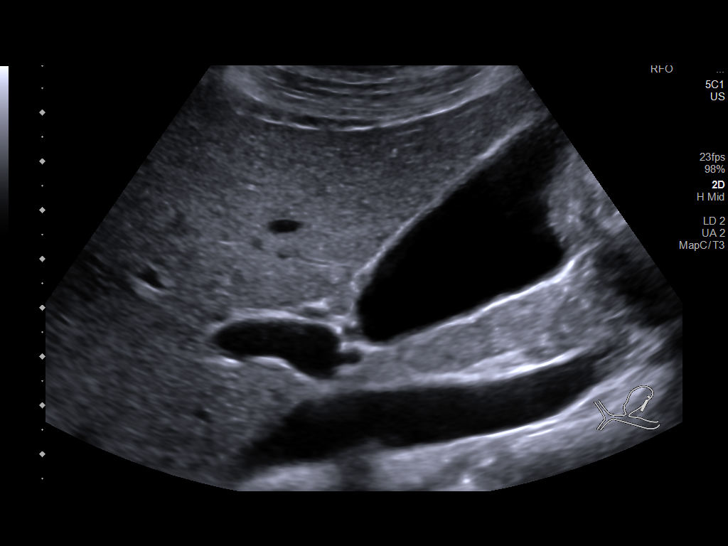
[im 11/74]
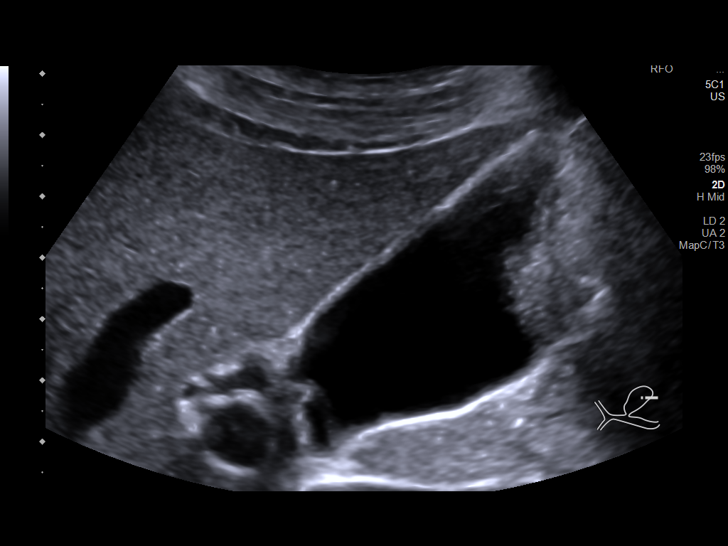
[im 19/74]
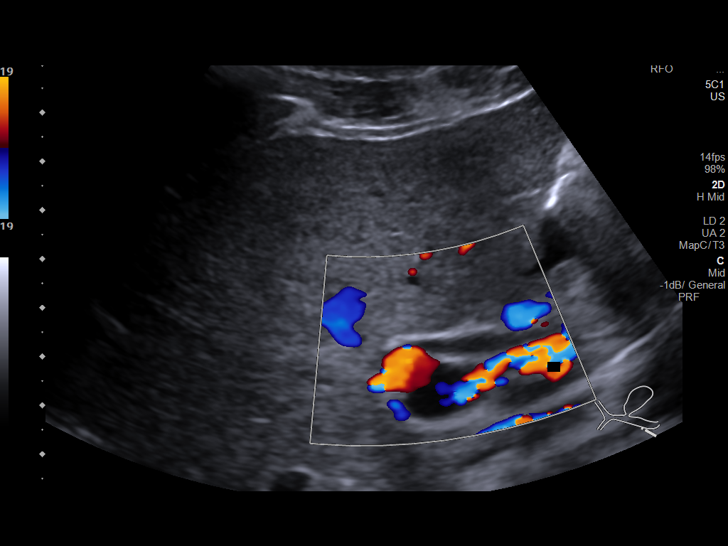
[im 26/74]
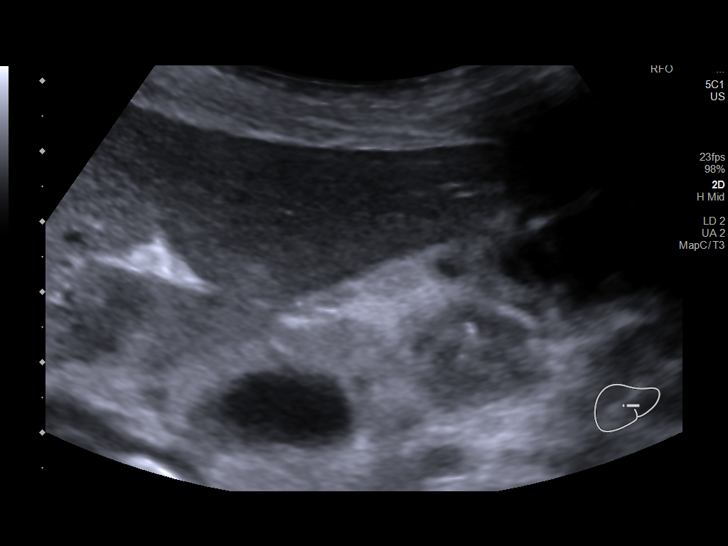
[im 33/74]
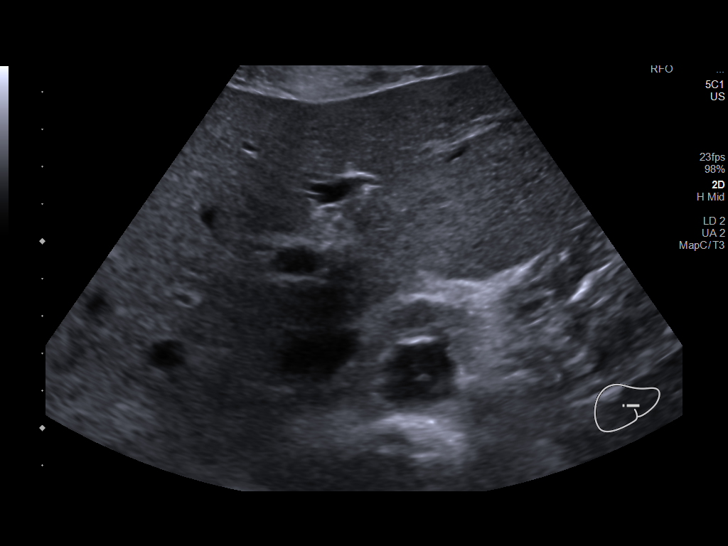
[im 41/74]
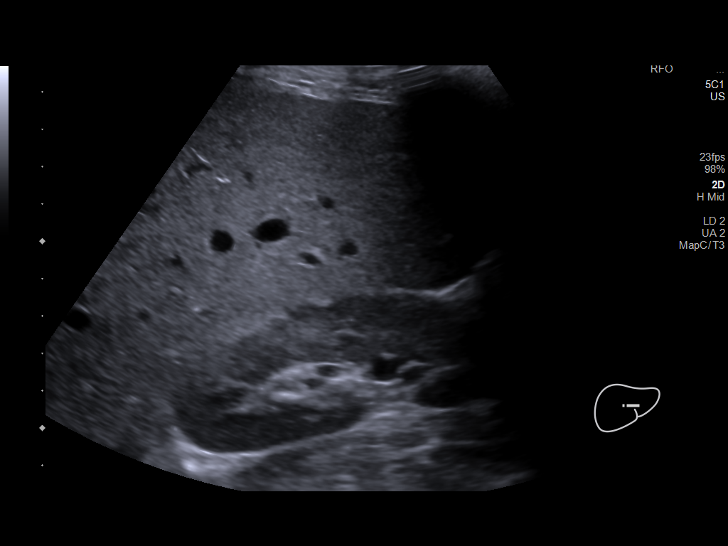
[im 48/74]
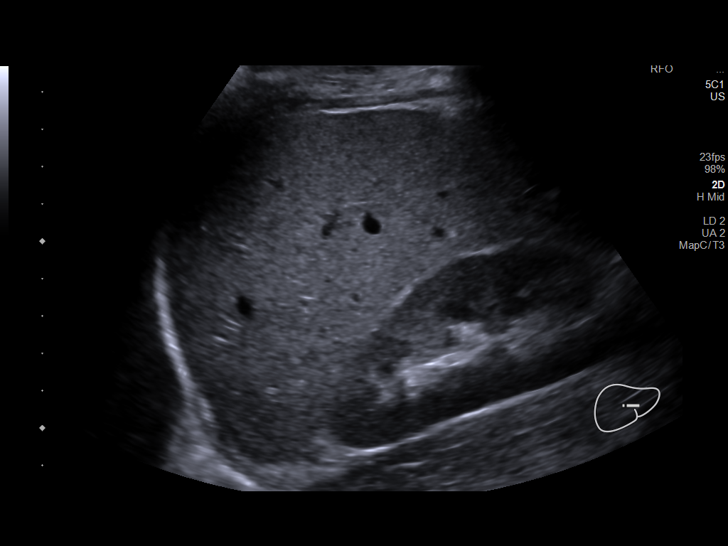
[im 55/74]
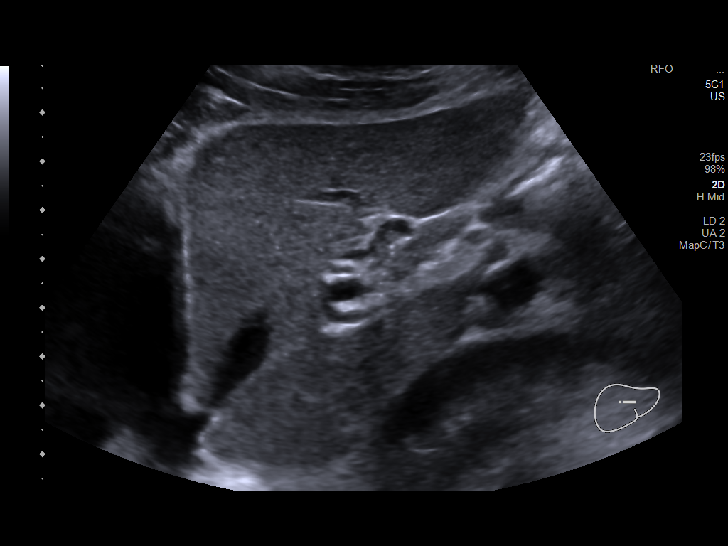
[im 63/74]
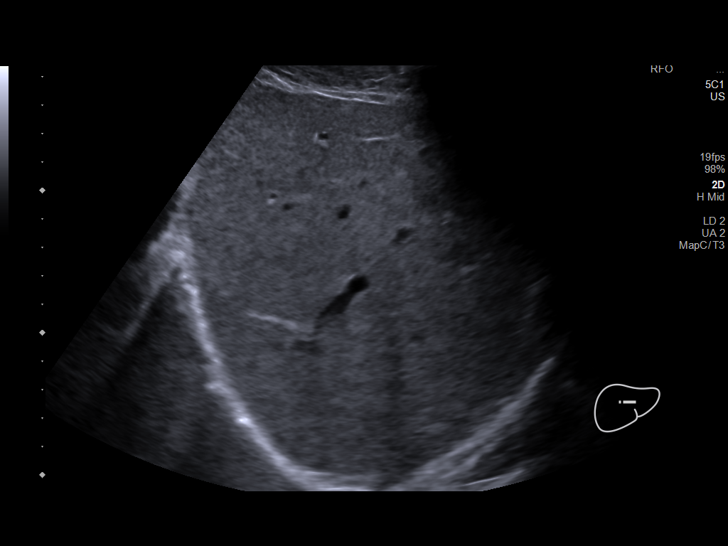
[im 70/74]
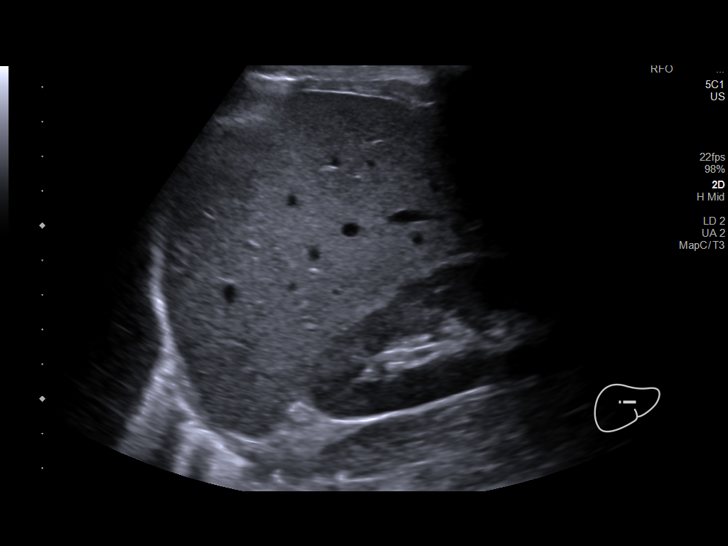

[Series 1001: us abdomen limited w/ elastography · 2 of 14 slices shown (2 of 2)]
[im 1/14]
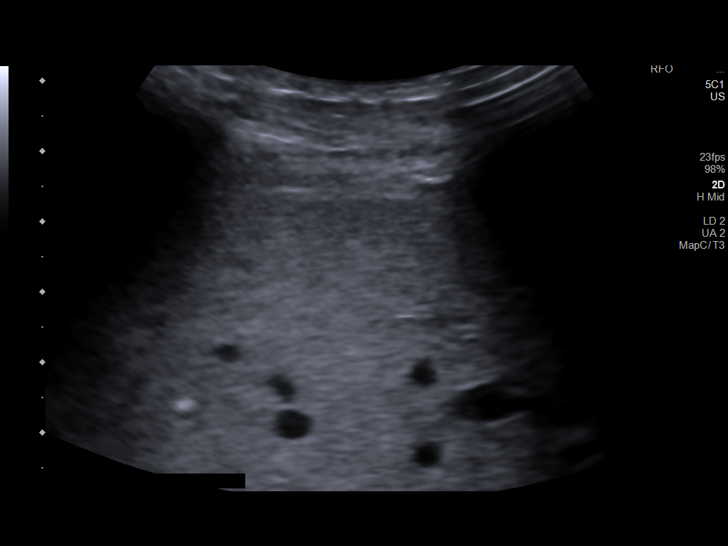
[im 9/14]
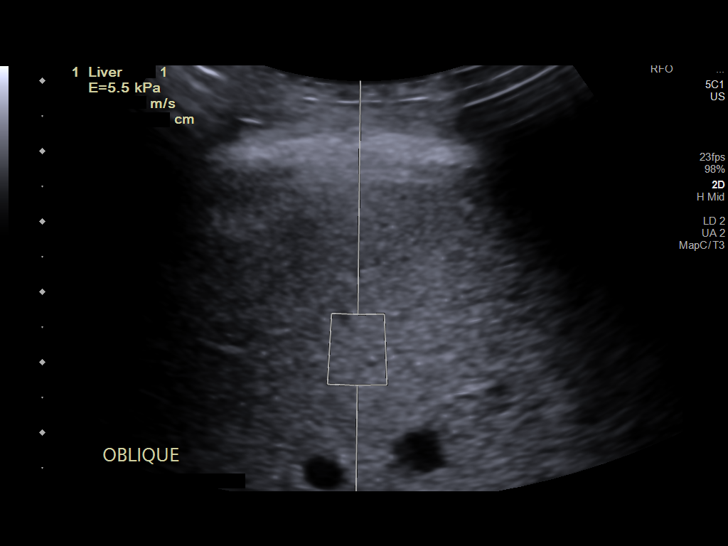

[12 of 25 positions shown; findings below may reference images not displayed]

FINDINGS: ULTRASOUND ABDOMEN LIMITED RIGHT UPPER QUADRANT

Gallbladder:

No gallstones or wall thickening visualized. No sonographic Murphy
sign noted.

Common bile duct:

Diameter: 3.8 mm

Liver:

No focal lesion identified. Within normal limits in parenchymal
echogenicity. Portal vein is patent on color Doppler imaging with
normal direction of blood flow towards the liver.

ULTRASOUND HEPATIC ELASTOGRAPHY

Device: Siemens Helix VTQ

Patient position: Oblique

Transducer 5 C1

Number of measurements: 10

Hepatic segment:  8

Median kPa:

IQR:

IQR/Median kPa ratio:

Data quality:

Diagnostic category: ?9 kPa: in the absence of other known clinical
signs, rules out cACLD
IMPRESSION: ULTRASOUND RUQ:

Normal appearance of the liver and biliary tree.

ULTRASOUND HEPATIC ELASTOGRAPHY:

Median kPa:

Diagnostic category: ?9 kPa: in the absence of other known clinical
signs, rules out cACLD

The use of hepatic elastography is applicable to patients with viral
hepatitis and non-alcoholic fatty liver disease. At this time, there
is insufficient data for the referenced cut-off values and use in
other causes of liver disease, including alcoholic liver disease.
Patients, however, may be assessed by elastography and serve as
their own reference standard/baseline.

In patients with non-alcoholic liver disease, the values suggesting
compensated advanced chronic liver disease (cACLD) may be lower, and
patients may need additional testing with elasticity results of [DATE]
kPa.

Please note that abnormal hepatic elasticity and shear wave
velocities may also be identified in clinical settings other than
with hepatic fibrosis, such as: acute hepatitis, elevated right
heart and central venous pressures including use of beta blockers,
Mayabel disease ([place]), infiltrative processes such as
mastocytosis/amyloidosis/infiltrative tumor/lymphoma, extrahepatic
cholestasis, with hyperemia in the post-prandial state, and with
liver transplantation. Correlation with patient history, laboratory
data, and clinical condition recommended.

## 2021-08-22 DIAGNOSIS — Z79899 Other long term (current) drug therapy: Secondary | ICD-10-CM | POA: Diagnosis not present

## 2021-08-22 DIAGNOSIS — F1124 Opioid dependence with opioid-induced mood disorder: Secondary | ICD-10-CM | POA: Diagnosis not present

## 2021-08-22 DIAGNOSIS — Z5181 Encounter for therapeutic drug level monitoring: Secondary | ICD-10-CM | POA: Diagnosis not present

## 2021-09-19 DIAGNOSIS — Z79899 Other long term (current) drug therapy: Secondary | ICD-10-CM | POA: Diagnosis not present

## 2021-09-19 DIAGNOSIS — F1124 Opioid dependence with opioid-induced mood disorder: Secondary | ICD-10-CM | POA: Diagnosis not present

## 2021-09-19 DIAGNOSIS — Z5181 Encounter for therapeutic drug level monitoring: Secondary | ICD-10-CM | POA: Diagnosis not present

## 2021-10-06 DIAGNOSIS — F1124 Opioid dependence with opioid-induced mood disorder: Secondary | ICD-10-CM | POA: Diagnosis not present

## 2021-10-06 DIAGNOSIS — Z5181 Encounter for therapeutic drug level monitoring: Secondary | ICD-10-CM | POA: Diagnosis not present

## 2021-10-06 DIAGNOSIS — Z79899 Other long term (current) drug therapy: Secondary | ICD-10-CM | POA: Diagnosis not present

## 2021-11-10 DIAGNOSIS — Z79899 Other long term (current) drug therapy: Secondary | ICD-10-CM | POA: Diagnosis not present

## 2021-11-10 DIAGNOSIS — Z5181 Encounter for therapeutic drug level monitoring: Secondary | ICD-10-CM | POA: Diagnosis not present

## 2021-11-10 DIAGNOSIS — F1124 Opioid dependence with opioid-induced mood disorder: Secondary | ICD-10-CM | POA: Diagnosis not present

## 2021-12-08 DIAGNOSIS — Z5181 Encounter for therapeutic drug level monitoring: Secondary | ICD-10-CM | POA: Diagnosis not present

## 2021-12-08 DIAGNOSIS — Z79899 Other long term (current) drug therapy: Secondary | ICD-10-CM | POA: Diagnosis not present

## 2021-12-08 DIAGNOSIS — F1124 Opioid dependence with opioid-induced mood disorder: Secondary | ICD-10-CM | POA: Diagnosis not present

## 2022-01-05 DIAGNOSIS — F1124 Opioid dependence with opioid-induced mood disorder: Secondary | ICD-10-CM | POA: Diagnosis not present

## 2022-01-05 DIAGNOSIS — Z79899 Other long term (current) drug therapy: Secondary | ICD-10-CM | POA: Diagnosis not present

## 2022-01-05 DIAGNOSIS — Z5181 Encounter for therapeutic drug level monitoring: Secondary | ICD-10-CM | POA: Diagnosis not present

## 2022-02-02 DIAGNOSIS — F1124 Opioid dependence with opioid-induced mood disorder: Secondary | ICD-10-CM | POA: Diagnosis not present

## 2022-02-02 DIAGNOSIS — Z5181 Encounter for therapeutic drug level monitoring: Secondary | ICD-10-CM | POA: Diagnosis not present

## 2022-02-02 DIAGNOSIS — Z79899 Other long term (current) drug therapy: Secondary | ICD-10-CM | POA: Diagnosis not present

## 2022-03-02 DIAGNOSIS — Z5181 Encounter for therapeutic drug level monitoring: Secondary | ICD-10-CM | POA: Diagnosis not present

## 2022-03-02 DIAGNOSIS — Z79899 Other long term (current) drug therapy: Secondary | ICD-10-CM | POA: Diagnosis not present

## 2022-03-02 DIAGNOSIS — F1124 Opioid dependence with opioid-induced mood disorder: Secondary | ICD-10-CM | POA: Diagnosis not present

## 2022-03-30 DIAGNOSIS — Z79899 Other long term (current) drug therapy: Secondary | ICD-10-CM | POA: Diagnosis not present

## 2022-03-30 DIAGNOSIS — F1124 Opioid dependence with opioid-induced mood disorder: Secondary | ICD-10-CM | POA: Diagnosis not present

## 2022-03-30 DIAGNOSIS — Z5181 Encounter for therapeutic drug level monitoring: Secondary | ICD-10-CM | POA: Diagnosis not present

## 2022-04-27 DIAGNOSIS — Z79899 Other long term (current) drug therapy: Secondary | ICD-10-CM | POA: Diagnosis not present

## 2022-04-27 DIAGNOSIS — Z5181 Encounter for therapeutic drug level monitoring: Secondary | ICD-10-CM | POA: Diagnosis not present

## 2022-04-27 DIAGNOSIS — F1124 Opioid dependence with opioid-induced mood disorder: Secondary | ICD-10-CM | POA: Diagnosis not present

## 2022-05-25 DIAGNOSIS — F1124 Opioid dependence with opioid-induced mood disorder: Secondary | ICD-10-CM | POA: Diagnosis not present

## 2022-05-25 DIAGNOSIS — Z5181 Encounter for therapeutic drug level monitoring: Secondary | ICD-10-CM | POA: Diagnosis not present

## 2022-05-25 DIAGNOSIS — Z79899 Other long term (current) drug therapy: Secondary | ICD-10-CM | POA: Diagnosis not present

## 2022-06-22 DIAGNOSIS — Z79899 Other long term (current) drug therapy: Secondary | ICD-10-CM | POA: Diagnosis not present

## 2022-06-22 DIAGNOSIS — F1124 Opioid dependence with opioid-induced mood disorder: Secondary | ICD-10-CM | POA: Diagnosis not present

## 2022-06-26 ENCOUNTER — Emergency Department (HOSPITAL_COMMUNITY): Payer: Medicaid Other

## 2022-06-26 ENCOUNTER — Emergency Department (HOSPITAL_COMMUNITY)
Admission: EM | Admit: 2022-06-26 | Discharge: 2022-06-26 | Disposition: A | Discharge status: Home / Self Care | Payer: Medicaid Other | Attending: Emergency Medicine | Admitting: Emergency Medicine

## 2022-06-26 ENCOUNTER — Encounter (HOSPITAL_COMMUNITY): Payer: Self-pay | Admitting: Emergency Medicine

## 2022-06-26 ENCOUNTER — Other Ambulatory Visit: Payer: Self-pay

## 2022-06-26 DIAGNOSIS — R0789 Other chest pain: Secondary | ICD-10-CM | POA: Diagnosis not present

## 2022-06-26 DIAGNOSIS — F17200 Nicotine dependence, unspecified, uncomplicated: Secondary | ICD-10-CM | POA: Diagnosis not present

## 2022-06-26 DIAGNOSIS — R202 Paresthesia of skin: Secondary | ICD-10-CM

## 2022-06-26 DIAGNOSIS — R001 Bradycardia, unspecified: Secondary | ICD-10-CM | POA: Diagnosis not present

## 2022-06-26 LAB — COMPREHENSIVE METABOLIC PANEL
ALT: 17 U/L (ref 0–44)
AST: 27 U/L (ref 15–41)
Albumin: 4.5 g/dL (ref 3.5–5.0)
Alkaline Phosphatase: 87 U/L (ref 38–126)
Anion gap: 10 (ref 5–15)
BUN: 19 mg/dL (ref 6–20)
CO2: 26 mmol/L (ref 22–32)
Calcium: 9.5 mg/dL (ref 8.9–10.3)
Chloride: 103 mmol/L (ref 98–111)
Creatinine, Ser: 0.72 mg/dL (ref 0.61–1.24)
GFR, Estimated: >60 mL/min (ref >60)
Glucose, Bld: 111 mg/dL — ABNORMAL HIGH (ref 70–99)
Potassium: 3.3 mmol/L — ABNORMAL LOW (ref 3.5–5.1)
Sodium: 139 mmol/L (ref 135–145)
Total Bilirubin: <0.1 mg/dL — ABNORMAL LOW (ref 0.3–1.2)
Total Protein: 8.4 g/dL — ABNORMAL HIGH (ref 6.5–8.1)

## 2022-06-26 LAB — CBC WITH DIFFERENTIAL/PLATELET
Abs Immature Granulocytes: 0.02 10*3/uL (ref 0.00–0.07)
Basophils Absolute: 0 10*3/uL (ref 0.0–0.1)
Basophils Relative: 0 %
Eosinophils Absolute: 0.1 10*3/uL (ref 0.0–0.5)
Eosinophils Relative: 1 %
HCT: 49.8 % (ref 39.0–52.0)
Hemoglobin: 16.3 g/dL (ref 13.0–17.0)
Immature Granulocytes: 0 %
Lymphocytes Relative: 31 %
Lymphs Abs: 3.5 10*3/uL (ref 0.7–4.0)
MCH: 29.1 pg (ref 26.0–34.0)
MCHC: 32.7 g/dL (ref 30.0–36.0)
MCV: 88.8 fL (ref 80.0–100.0)
Monocytes Absolute: 0.6 10*3/uL (ref 0.1–1.0)
Monocytes Relative: 5 %
Neutro Abs: 7 10*3/uL (ref 1.7–7.7)
Neutrophils Relative %: 63 %
Platelets: 320 10*3/uL (ref 150–400)
RBC: 5.61 MIL/uL (ref 4.22–5.81)
RDW: 13.8 % (ref 11.5–15.5)
WBC: 11.1 10*3/uL — ABNORMAL HIGH (ref 4.0–10.5)
nRBC: 0 % (ref 0.0–0.2)

## 2022-06-26 MED ORDER — DIPHENHYDRAMINE HCL 50 MG/ML IJ SOLN
25.0000 mg | Freq: Once | INTRAMUSCULAR | Status: AC
  Administered 2022-06-26: 25 mg via INTRAVENOUS
  Filled 2022-06-26: qty 1

## 2022-06-26 MED ORDER — SODIUM CHLORIDE 0.9 % IV BOLUS
1000.0000 mL | Freq: Once | INTRAVENOUS | Status: AC
  Administered 2022-06-26: 1000 mL via INTRAVENOUS

## 2022-06-26 NOTE — ED Provider Notes (Signed)
[facility] Provider Note   CSN: [id number] Arrival date & time: 06/26/22  0447     History  Chief Complaint  Patient presents with   Multiple Complaints    [facility] is a 42 y.o. male.  Patient is a 42 year old male with history of hepatitis C, tobacco abuse.  Patient presenting today with complaints of tightness in his chest, tingling all over, and noticing small bumps on his legs and feet.  This started acutely at approximately 4:30 AM and woke him from sleep.  He denies any new contacts or exposures.  He denies any drug or alcohol use.  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  The history is provided by the patient.       Home Medications Prior to Admission medications   Medication Sig Start Date End Date Taking? Authorizing Provider  ALPRAZolam Duanne Moron) 1 MG tablet Take 1 mg by mouth 3 (three) times daily. 07/24/19   [provider]  buprenorphine (SUBUTEX) 8 MG SUBL SL tablet 8 mg 2 (two) times daily. 07/24/19   [provider]  doxycycline (VIBRAMYCIN) 100 MG capsule Take 1 capsule (100 mg total) by mouth 2 (two) times daily. One po bid x 7 days 06/09/20   Veryl Speak, MD      Allergies    Ritalin [methylphenidate hcl] and Robitussin (alcohol free) [guaifenesin]    Review of Systems   Review of Systems  All other systems reviewed and are negative.   Physical Exam Updated Vital Signs BP 110/69 (BP Location: Right Arm)   Pulse (!) 53   Temp 97.8 F (36.6 C) (Oral)   Resp 17   Ht 6' (1.829 m)   Wt 63.5 kg   SpO2 98%   BMI 18.99 kg/m  Physical Exam Vitals and nursing note reviewed.  Constitutional:      General: He is not in acute distress.    Appearance: He is well-developed. He is not diaphoretic.  HENT:     Head: Normocephalic and atraumatic.  Cardiovascular:     Rate and Rhythm: Normal rate and regular rhythm.     Heart sounds: No murmur heard.     No friction rub.  Pulmonary:     Effort: Pulmonary effort is normal. No respiratory distress.     Breath sounds: Normal breath sounds. No wheezing or rales.  Abdominal:     General: Bowel sounds are normal. There is no distension.     Palpations: Abdomen is soft.     Tenderness: There is no abdominal tenderness.  Musculoskeletal:        General: Normal range of motion.     Cervical back: Normal range of motion and neck supple.  Skin:    General: Skin is warm and dry.     Comments: To his ankles and feet, there is a very faint, barely noticeable speckled areas of erythema.    Neurological:     Mental Status: He is alert and oriented to person, place, and time.     Coordination: Coordination normal.     ED Results / Procedures / Treatments   Labs (all labs ordered are listed, but only abnormal results are displayed) Labs Reviewed - No data to display  EKG EKG Interpretation  Date/Time:  Friday June 26 2022 05:22:08 EST Ventricular Rate:  48 PR Interval:  163 QRS Duration: 100 QT Interval:  444 QTC Calculation: 397 R Axis:   56  Text Interpretation: Sinus bradycardia Otherwise normal ECG Confirmed by Veryl Speak [phone]) on 06/26/2022 5:41:40 AM  Radiology No results found.  Procedures Procedures    Medications Ordered in ED Medications  sodium chloride 0.9 % bolus 1,000 mL (has no administration in time range)  diphenhydrAMINE (BENADRYL) injection 25 mg (has no administration in time range)    ED Course/ Medical Decision Making/ A&P  Patient is a 42 year old male presenting with complaints of tightness in his chest and "tingling all over".  This started acutely this morning at approximately 4:30 AM and woke him up from sleep.  He also describes noticing red bumps on his ankles and feet yesterday.  He arrives here with stable vital signs and is clinically well-appearing.  Physical examination basically unremarkable with the exception of a very subtle  rash.  Workup initiated including CBC and basic metabolic panel, both of which were unremarkable.  Chest x-ray obtained showing no acute process.  Patient has received IV fluids along with IV Benadryl and seems to be feeling better.  I am uncertain as to the etiology of the patient's symptoms, however I suspect some sort of allergic cause.  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  At this point, I feel as though he can safely be discharged with continued antihistamines, rest, and as needed return.  Final Clinical Impression(s) / ED Diagnoses Final diagnoses:  None    Rx / DC Orders ED Discharge Orders     None         Veryl Speak, MD 06/26/22 [phone]

## 2022-06-26 NOTE — ED Triage Notes (Signed)
Pt c/o feeling tingling all over his body, upper lip swelling, small red bumps to his feet and legs, and some chest tightness.   Pt states he takes subutex and valium daily, states he hasn't missed any doses.

## 2022-06-26 NOTE — Discharge Instructions (Signed)
Continue medications as previously prescribed.  Begin taking Benadryl 25 mg every 6 hours as needed for recurrence of symptoms.  Return to the emergency department if your symptoms significantly worsen or change.

## 2022-07-20 DIAGNOSIS — Z5181 Encounter for therapeutic drug level monitoring: Secondary | ICD-10-CM | POA: Diagnosis not present

## 2022-07-20 DIAGNOSIS — Z79899 Other long term (current) drug therapy: Secondary | ICD-10-CM | POA: Diagnosis not present

## 2022-07-20 DIAGNOSIS — F1124 Opioid dependence with opioid-induced mood disorder: Secondary | ICD-10-CM | POA: Diagnosis not present

## 2022-08-17 DIAGNOSIS — Z5181 Encounter for therapeutic drug level monitoring: Secondary | ICD-10-CM | POA: Diagnosis not present

## 2022-08-17 DIAGNOSIS — F1124 Opioid dependence with opioid-induced mood disorder: Secondary | ICD-10-CM | POA: Diagnosis not present

## 2022-08-17 DIAGNOSIS — Z79899 Other long term (current) drug therapy: Secondary | ICD-10-CM | POA: Diagnosis not present

## 2022-09-14 DIAGNOSIS — Z79899 Other long term (current) drug therapy: Secondary | ICD-10-CM | POA: Diagnosis not present

## 2022-09-14 DIAGNOSIS — F1124 Opioid dependence with opioid-induced mood disorder: Secondary | ICD-10-CM | POA: Diagnosis not present

## 2022-10-14 DIAGNOSIS — Z79899 Other long term (current) drug therapy: Secondary | ICD-10-CM | POA: Diagnosis not present

## 2022-10-14 DIAGNOSIS — F1124 Opioid dependence with opioid-induced mood disorder: Secondary | ICD-10-CM | POA: Diagnosis not present

## 2022-11-17 DIAGNOSIS — F1124 Opioid dependence with opioid-induced mood disorder: Secondary | ICD-10-CM | POA: Diagnosis not present

## 2022-11-17 DIAGNOSIS — Z79899 Other long term (current) drug therapy: Secondary | ICD-10-CM | POA: Diagnosis not present

## 2022-12-21 DIAGNOSIS — Z79899 Other long term (current) drug therapy: Secondary | ICD-10-CM | POA: Diagnosis not present

## 2022-12-21 DIAGNOSIS — F1124 Opioid dependence with opioid-induced mood disorder: Secondary | ICD-10-CM | POA: Diagnosis not present
